# Patient Record
Sex: Male | Born: 1935 | Race: White | Hispanic: No | Marital: Married | Smoking: Former smoker
Health system: Southern US, Community
[De-identification: ages and names within clinical notes are randomized; demographics above are authoritative.]

## PROBLEM LIST (undated history)

## (undated) DIAGNOSIS — I4891 Unspecified atrial fibrillation: Secondary | ICD-10-CM

## (undated) DIAGNOSIS — E785 Hyperlipidemia, unspecified: Secondary | ICD-10-CM

## (undated) DIAGNOSIS — I10 Essential (primary) hypertension: Secondary | ICD-10-CM

## (undated) DIAGNOSIS — M169 Osteoarthritis of hip, unspecified: Secondary | ICD-10-CM

## (undated) DIAGNOSIS — M1711 Unilateral primary osteoarthritis, right knee: Secondary | ICD-10-CM

## (undated) DIAGNOSIS — E079 Disorder of thyroid, unspecified: Secondary | ICD-10-CM

## (undated) DIAGNOSIS — M25579 Pain in unspecified ankle and joints of unspecified foot: Secondary | ICD-10-CM

## (undated) DIAGNOSIS — I219 Acute myocardial infarction, unspecified: Secondary | ICD-10-CM

## (undated) HISTORY — DX: Unspecified atrial fibrillation: I48.91

## (undated) HISTORY — DX: Essential (primary) hypertension: I10

## (undated) HISTORY — DX: Hyperlipidemia, unspecified: E78.5

## (undated) HISTORY — DX: Acute myocardial infarction, unspecified: I21.9

## (undated) HISTORY — DX: Osteoarthritis of hip, unspecified: M16.9

## (undated) HISTORY — PX: CORONARY ANGIOPLASTY WITH STENT PLACEMENT: SHX49

## (undated) HISTORY — DX: Disorder of thyroid, unspecified: E07.9

## (undated) HISTORY — DX: Pain in unspecified ankle and joints of unspecified foot: M25.579

## (undated) HISTORY — DX: Unilateral primary osteoarthritis, right knee: M17.11

---

## 2005-01-07 ENCOUNTER — Ambulatory Visit: Payer: Self-pay | Admitting: Gastroenterology

## 2005-07-31 ENCOUNTER — Ambulatory Visit: Payer: Self-pay | Admitting: Family Medicine

## 2005-12-09 ENCOUNTER — Emergency Department: Payer: Self-pay | Admitting: Emergency Medicine

## 2005-12-09 ENCOUNTER — Other Ambulatory Visit: Payer: Self-pay

## 2007-05-07 ENCOUNTER — Ambulatory Visit: Payer: Self-pay | Admitting: Family Medicine

## 2013-06-22 DIAGNOSIS — I2510 Atherosclerotic heart disease of native coronary artery without angina pectoris: Secondary | ICD-10-CM | POA: Insufficient documentation

## 2013-06-22 DIAGNOSIS — I10 Essential (primary) hypertension: Secondary | ICD-10-CM | POA: Insufficient documentation

## 2013-06-22 DIAGNOSIS — I4891 Unspecified atrial fibrillation: Secondary | ICD-10-CM | POA: Insufficient documentation

## 2013-06-22 DIAGNOSIS — E039 Hypothyroidism, unspecified: Secondary | ICD-10-CM | POA: Insufficient documentation

## 2013-06-22 DIAGNOSIS — I482 Chronic atrial fibrillation, unspecified: Secondary | ICD-10-CM | POA: Insufficient documentation

## 2013-11-14 DIAGNOSIS — E785 Hyperlipidemia, unspecified: Secondary | ICD-10-CM | POA: Insufficient documentation

## 2013-11-14 DIAGNOSIS — I2109 ST elevation (STEMI) myocardial infarction involving other coronary artery of anterior wall: Secondary | ICD-10-CM | POA: Insufficient documentation

## 2013-11-14 DIAGNOSIS — Z9889 Other specified postprocedural states: Secondary | ICD-10-CM | POA: Insufficient documentation

## 2014-11-20 DIAGNOSIS — M25579 Pain in unspecified ankle and joints of unspecified foot: Secondary | ICD-10-CM

## 2014-11-20 HISTORY — DX: Pain in unspecified ankle and joints of unspecified foot: M25.579

## 2015-04-02 ENCOUNTER — Encounter: Admission: RE | Payer: Self-pay | Source: Ambulatory Visit

## 2015-04-02 ENCOUNTER — Ambulatory Visit: Admission: RE | Admit: 2015-04-02 | Payer: Self-pay | Source: Ambulatory Visit | Admitting: Ophthalmology

## 2015-04-02 SURGERY — PHACOEMULSIFICATION, CATARACT, WITH IOL INSERTION
Anesthesia: Topical | Laterality: Left

## 2015-04-05 DIAGNOSIS — N183 Chronic kidney disease, stage 3 unspecified: Secondary | ICD-10-CM | POA: Insufficient documentation

## 2015-04-05 DIAGNOSIS — Z79891 Long term (current) use of opiate analgesic: Secondary | ICD-10-CM | POA: Insufficient documentation

## 2015-04-05 DIAGNOSIS — M169 Osteoarthritis of hip, unspecified: Secondary | ICD-10-CM

## 2015-04-05 HISTORY — DX: Osteoarthritis of hip, unspecified: M16.9

## 2015-04-24 ENCOUNTER — Telehealth: Payer: Self-pay | Admitting: *Deleted

## 2015-04-24 NOTE — Telephone Encounter (Signed)
lm made the patient aware that I would love to make an appt for him. I asked if he could please return my call....TD

## 2015-05-17 ENCOUNTER — Ambulatory Visit: Payer: Medicare Other | Attending: Anesthesiology | Admitting: Anesthesiology

## 2015-05-17 ENCOUNTER — Encounter: Payer: Self-pay | Admitting: Anesthesiology

## 2015-05-17 VITALS — BP 100/68 | HR 73 | Temp 98.1°F | Resp 16 | Ht 72.0 in | Wt 206.0 lb

## 2015-05-17 DIAGNOSIS — M1711 Unilateral primary osteoarthritis, right knee: Secondary | ICD-10-CM

## 2015-05-17 DIAGNOSIS — M19079 Primary osteoarthritis, unspecified ankle and foot: Secondary | ICD-10-CM

## 2015-05-17 DIAGNOSIS — M25561 Pain in right knee: Principal | ICD-10-CM

## 2015-05-17 DIAGNOSIS — G8929 Other chronic pain: Secondary | ICD-10-CM

## 2015-05-17 MED ORDER — MELOXICAM 7.5 MG PO TABS: 7.5000 mg | ORAL_TABLET | Freq: Two times a day (BID) | ORAL | Status: DC

## 2015-05-17 NOTE — Progress Notes (Signed)
Safety precautions to be maintained throughout the outpatient stay will include: orient to surroundings, keep bed in low position, maintain call bell within reach at all times, provide assistance with transfer out of bed and ambulation.  

## 2015-05-17 NOTE — Patient Instructions (Signed)
A prescription for Meloxicam was sent to your pharmacy. 

## 2015-05-22 DIAGNOSIS — M1711 Unilateral primary osteoarthritis, right knee: Secondary | ICD-10-CM

## 2015-05-22 HISTORY — DX: Unilateral primary osteoarthritis, right knee: M17.11
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
## 2015-06-15 ENCOUNTER — Ambulatory Visit: Payer: Medicare Other | Attending: Anesthesiology | Admitting: Anesthesiology

## 2015-06-15 ENCOUNTER — Encounter: Payer: Self-pay | Admitting: Anesthesiology

## 2015-06-15 VITALS — BP 110/73 | HR 80 | Temp 99.1°F | Resp 16 | Ht 71.0 in | Wt 206.0 lb

## 2015-06-15 DIAGNOSIS — M25579 Pain in unspecified ankle and joints of unspecified foot: Secondary | ICD-10-CM | POA: Diagnosis present

## 2015-06-15 DIAGNOSIS — M25572 Pain in left ankle and joints of left foot: Secondary | ICD-10-CM | POA: Diagnosis not present

## 2015-06-15 DIAGNOSIS — Z7901 Long term (current) use of anticoagulants: Secondary | ICD-10-CM | POA: Diagnosis not present

## 2015-06-15 DIAGNOSIS — G8929 Other chronic pain: Secondary | ICD-10-CM | POA: Diagnosis not present

## 2015-06-15 DIAGNOSIS — M19072 Primary osteoarthritis, left ankle and foot: Secondary | ICD-10-CM | POA: Insufficient documentation

## 2015-06-15 DIAGNOSIS — I4891 Unspecified atrial fibrillation: Secondary | ICD-10-CM | POA: Diagnosis not present

## 2015-06-15 DIAGNOSIS — I2510 Atherosclerotic heart disease of native coronary artery without angina pectoris: Secondary | ICD-10-CM | POA: Diagnosis not present

## 2015-06-15 DIAGNOSIS — M25561 Pain in right knee: Secondary | ICD-10-CM | POA: Insufficient documentation

## 2015-06-15 DIAGNOSIS — M19071 Primary osteoarthritis, right ankle and foot: Secondary | ICD-10-CM | POA: Insufficient documentation

## 2015-06-15 DIAGNOSIS — M1711 Unilateral primary osteoarthritis, right knee: Secondary | ICD-10-CM

## 2015-06-15 DIAGNOSIS — M153 Secondary multiple arthritis: Secondary | ICD-10-CM

## 2015-06-15 DIAGNOSIS — M25562 Pain in left knee: Secondary | ICD-10-CM | POA: Diagnosis not present

## 2015-06-15 DIAGNOSIS — M25571 Pain in right ankle and joints of right foot: Secondary | ICD-10-CM | POA: Diagnosis not present

## 2015-06-15 MED ORDER — MELOXICAM 7.5 MG PO TABS: 7.5000 mg | ORAL_TABLET | Freq: Two times a day (BID) | ORAL | Status: DC

## 2015-06-15 NOTE — Progress Notes (Signed)
Safety precautions to be maintained throughout the outpatient stay will include: orient to surroundings, keep bed in low position, maintain call bell within reach at all times, provide assistance with transfer out of bed and ambulation.  

## 2015-06-17 NOTE — Progress Notes (Signed)
   Subjective:    Patient ID: Louis Wells, male    DOB: 05/13/1932, 82 y.o.   MRN: [id number]  HPI  This 82 year old gentleman presented with bila ankle pain secondary to osteoarthritis of all the joints Physical examination showed there to be in both of his knees. He indicated that the right knee was mo and accordingly with plan to perform a right femoral nerve block for him today The patient takes Eliquis as an anticoagulant to manage prophylactically his cardiac disease I explained to the patient that I would no discontinue the other quis to perform the femoral artery nerve bl for him without discussing it with his card I explained to him that in the performance to the femoral artery and it may be catastrophic to have a perforation of that femoral artery while the patient is on Eliquis Louis Wells made the several attempts to contact his  But we were not successful in doing so  Review of Systems  Constitutional: Negative.   HENT: Negative.   Eyes: Negative.   Respiratory: Negative.   Cardiovascular:       This patient is taken the anticoagulant  medicine called Eliquis to manage his coronary artery disease and is severe atrial fibrillation It is prudent to discontinue the Eliquis prior to perferve block close to major vessels  Gastrointestinal: Negative.   Endocrine: Negative.   Genitourinary: Negative.   Musculoskeletal: Positive for myalgias, joint swelling and arthralgias.       Bilateral knee pain and  Bilateral ankle pain secondary to bilateral senescent osteoarthritis of both ankles and both knee joints  Skin: Negative.   Allergic/Immunologic: Negative.   Neurological: Negative.   Hematological: Negative.   Psychiatric/Behavioral: Negative.        Objective:   Physical Exam  Cardiovascular:  Patient ambulated with assistance of a walker He appeared to be in no significant distress His blood pressure was 110/73 mmHg His pulse was 80 bpm Equal and slightly  irregular Temperature was 99.27F Respirations were 16 breaths per min S PO2 was 99% Chest is clinically clear There are no adventitious sounds Abdomen is soft and nontender There is no palpable organomegaly There is no significant lymphadenopathy Pupils are equal and reactive Cranial nerves are intact There is tenderness of both knees with decrease in range of motion and significant crepitus in both knees especially in the knees  Nursing note and vitals reviewed.         Assessment & Plan:   Assessment 1 hronic bilateral knee pain 2  severe osteoarthritis of both knee 3 pain in both ankles 4 Osteoarthritis of both ankles 5 status post atrial fibrillation    Plan of management 1 I attempted to contact the patient's cardiologist but was unsuccessful 2 after some discussion the patient indicated that he is not interested the request Eliquis 3 therefore I would postpone the right femoral block which was planned for him today 4 I will prescribed meloxicam 7.5 mg twice a day and give him 45 tablets 5 i will follow-up with him in about 1 month's time to explore other therapeutic options    Established patient        Level III    Tod PersiaWinston Tanaja Ganger M.D.

## 2015-06-18 ENCOUNTER — Telehealth: Payer: Self-pay

## 2015-06-18 NOTE — Telephone Encounter (Signed)
Returned call to patient, message left, informing patient that I do not know if Dr. Starling MannsParris plans to write prescription, can discuss at next appt.

## 2015-06-18 NOTE — Telephone Encounter (Signed)
Wants to know is Dr. Starling MannsParris going to write pt prescription

## 2015-07-03 ENCOUNTER — Ambulatory Visit
Admission: RE | Admit: 2015-07-03 | Discharge: 2015-07-03 | Disposition: A | Discharge status: Home / Self Care | Payer: Medicare Other | Source: Ambulatory Visit | Attending: Pain Medicine | Admitting: Pain Medicine

## 2015-07-03 ENCOUNTER — Other Ambulatory Visit
Admission: RE | Admit: 2015-07-03 | Discharge: 2015-07-03 | Disposition: A | Discharge status: Home / Self Care | Payer: Medicare Other | Source: Ambulatory Visit | Attending: Pain Medicine | Admitting: Pain Medicine

## 2015-07-03 ENCOUNTER — Encounter: Payer: Self-pay | Admitting: Pain Medicine

## 2015-07-03 ENCOUNTER — Ambulatory Visit (HOSPITAL_BASED_OUTPATIENT_CLINIC_OR_DEPARTMENT_OTHER): Payer: Medicare Other | Admitting: Pain Medicine

## 2015-07-03 VITALS — BP 118/66 | HR 89 | Temp 98.4°F | Resp 16 | Ht 71.0 in | Wt 206.0 lb

## 2015-07-03 DIAGNOSIS — G8929 Other chronic pain: Secondary | ICD-10-CM

## 2015-07-03 DIAGNOSIS — I739 Peripheral vascular disease, unspecified: Secondary | ICD-10-CM | POA: Diagnosis not present

## 2015-07-03 DIAGNOSIS — M25571 Pain in right ankle and joints of right foot: Secondary | ICD-10-CM | POA: Insufficient documentation

## 2015-07-03 DIAGNOSIS — M19071 Primary osteoarthritis, right ankle and foot: Secondary | ICD-10-CM

## 2015-07-03 DIAGNOSIS — M25551 Pain in right hip: Secondary | ICD-10-CM | POA: Insufficient documentation

## 2015-07-03 DIAGNOSIS — Z5181 Encounter for therapeutic drug level monitoring: Secondary | ICD-10-CM

## 2015-07-03 DIAGNOSIS — Z79891 Long term (current) use of opiate analgesic: Secondary | ICD-10-CM | POA: Diagnosis not present

## 2015-07-03 DIAGNOSIS — M25471 Effusion, right ankle: Secondary | ICD-10-CM | POA: Insufficient documentation

## 2015-07-03 DIAGNOSIS — M170 Bilateral primary osteoarthritis of knee: Secondary | ICD-10-CM

## 2015-07-03 DIAGNOSIS — M1611 Unilateral primary osteoarthritis, right hip: Secondary | ICD-10-CM

## 2015-07-03 DIAGNOSIS — F1190 Opioid use, unspecified, uncomplicated: Secondary | ICD-10-CM

## 2015-07-03 DIAGNOSIS — M25572 Pain in left ankle and joints of left foot: Principal | ICD-10-CM

## 2015-07-03 DIAGNOSIS — M19072 Primary osteoarthritis, left ankle and foot: Secondary | ICD-10-CM

## 2015-07-03 DIAGNOSIS — M25579 Pain in unspecified ankle and joints of unspecified foot: Secondary | ICD-10-CM | POA: Insufficient documentation

## 2015-07-03 DIAGNOSIS — Z0189 Encounter for other specified special examinations: Secondary | ICD-10-CM

## 2015-07-03 DIAGNOSIS — Z79899 Other long term (current) drug therapy: Secondary | ICD-10-CM

## 2015-07-03 DIAGNOSIS — R209 Unspecified disturbances of skin sensation: Secondary | ICD-10-CM

## 2015-07-03 DIAGNOSIS — M25561 Pain in right knee: Secondary | ICD-10-CM | POA: Insufficient documentation

## 2015-07-03 DIAGNOSIS — M25562 Pain in left knee: Secondary | ICD-10-CM

## 2015-07-03 LAB — COMPREHENSIVE METABOLIC PANEL
ALBUMIN: 4.2 g/dL (ref 3.5–5.0)
ALK PHOS: 87 U/L (ref 38–126)
ALT: 11 U/L — AB (ref 17–63)
ANION GAP: 6 (ref 5–15)
AST: 22 U/L (ref 15–41)
BUN: 32 mg/dL — ABNORMAL HIGH (ref 6–20)
CALCIUM: 9 mg/dL (ref 8.9–10.3)
CHLORIDE: 106 mmol/L (ref 101–111)
CO2: 25 mmol/L (ref 22–32)
CREATININE: 1.45 mg/dL — AB (ref 0.61–1.24)
GFR calc Af Amer: 51 mL/min — ABNORMAL LOW (ref >60)
GFR calc non Af Amer: 44 mL/min — ABNORMAL LOW (ref >60)
GLUCOSE: 92 mg/dL (ref 65–99)
Potassium: 5.3 mmol/L — ABNORMAL HIGH (ref 3.5–5.1)
Sodium: 137 mmol/L (ref 135–145)
Total Bilirubin: 0.6 mg/dL (ref 0.3–1.2)
Total Protein: 7.4 g/dL (ref 6.5–8.1)

## 2015-07-03 LAB — VITAMIN B12: VITAMIN B 12: 806 pg/mL (ref 180–914)

## 2015-07-03 LAB — MAGNESIUM: Magnesium: 2.1 mg/dL (ref 1.7–2.4)

## 2015-07-03 LAB — SEDIMENTATION RATE: SED RATE: 26 mm/h — AB (ref 0–20)

## 2015-07-03 LAB — C-REACTIVE PROTEIN: CRP: 0.7 mg/dL (ref <1.0)

## 2015-07-03 NOTE — Progress Notes (Signed)
Safety precautions to be maintained throughout the outpatient stay will include: orient to surroundings, keep bed in low position, maintain call bell within reach at all times, provide assistance with transfer out of bed and ambulation. Hydrocodone pill count #55/60  Filled 06-29-15

## 2015-07-03 NOTE — Progress Notes (Signed)
Patient's Name: Louis Wells  Patient type: New patient  MRN: [id number]  Service setting: Ambulatory outpatient  DOB: 08/15/1932  Location: [facility]  DOS: 07/03/2015  Primary Care Physician: No primary care provider on file.  Note by: Phuong Hillary A. Laban EmperorNaveira, M.D, DABA, DABAPM, DABPM, DABIPP, FIPP  Referring Physician: Dayna BarkerBehalal-Bock, Christele*  Specialty: Board-Certified Interventional Pain Management     Primary Reason(s) for Visit: Initial Patient Evaluation CC: Knee Pain; Ankle Pain; and Hip Pain   HPI  Louis Wells is a 82 y.o. year old, male patient, who comes today for an initial evaluation. He has Atrial fibrillation (HCC); Acute myocardial infarction of anterior wall (HCC); Arteriosclerosis of coronary artery; Chronic kidney disease (CKD), stage III (moderate); Long term current use of opiate analgesic; History of cardiac catheterization; BP (high blood pressure); HLD (hyperlipidemia); Adult hypothyroidism; Chronic pain; Long term prescription opiate use; Opiate use; Encounter for therapeutic drug level monitoring; Encounter for pain management planning; Disturbance of skin sensation; Chronic right hip pain; Chronic ankle pain (Bilateral) (L>R); Chronic knee pain (Bilateral) (R>L); Osteoarthritis of knee (Bilateral) (R>L); Osteoarthritis of hip (Right); and Osteoarthritis of ankle (Bilateral) (L>R) on his problem list.. His primarily concern today is the Knee Pain; Ankle Pain; and Hip Pain   Pain Assessment: Self-Reported Pain Score: 4  Reported level of pain is compatible with clinical observations Pain Type: Chronic pain Pain Location: Knee Pain Orientation: Right Pain Descriptors / Indicators: Sharp Pain Frequency: Constant  Onset and Duration: Gradual, Date of onset: 3 years ago and Present longer than 3 months Cause of pain: Arthritis Severity: Getting worse, NAS-11 at its worse: 10/10, NAS-11 at its best: 7/10, NAS-11 now: 4/10 and NAS-11 on the  average: 8/10 Timing: Not influenced by the time of the day and During activity or exercise Aggravating Factors: Climbing, Kneeling, Motion, Prolonged standing, Squatting, Stooping , Walking, Walking uphill and Walking downhill Alleviating Factors: Sitting Associated Problems: Swelling and Pain that wakes patient up Quality of Pain: Aching, Agonizing, Deep, Disabling, Fearful, Horrible, Sharp, Throbbing and Uncomfortable Previous Examinations or Tests: X-rays and Orthoperdic evaluation Previous Treatments: Hip and knee injections  The patient comes into the clinics today for the first time for a chronic pain management evaluation. The patient comes in today for the first time to see me in a wheelchair complaining of right hip and bilateral knee pain as well as bilateral ankle pain. He indicates that he can do rather well by taking 4 Tylenol per day plus his to hydrocodone. Today I have reviewed the results of his old lab work, which we will be repeating today, but assuming that everything is still the same his liver functions seems to be okay but his kidney function is the one that is a little deficient. Because of this I have recommended that he stop using the meloxicam and go back to taking the Tylenol but to also make sure that it's the 500 mg Tylenol and not the 650. Using the 500 mg Tylenol also, he would be ingesting 2000 mg/day plus another 650 from the 2 hydrocodone tablets. This will bring it up to 2650 mg/day, which is acceptable.  In terms of the meloxicam I have recommended that he stop this for 3 reasons: The first reason is that his GFR is decreased. The second reason is that he has a cardiovascular history and NSAIDs may increase the risk of cardiovascular events. The third reason is that he is on a blood thinner and has a prior history of  bleeding ulcers.   Historic Controlled Substance Pharmacotherapy Review  Previously Prescribed Opioids: Hydrocodone/APAP 5/325 one twice a day (10  mg/day of hydrocodone) Currently Prescribed Analgesic: Hydrocodone/APAP 5/325 one twice a day (10 mg/day of hydrocodone) Medications: Patient brought medications to be checked, as requested MME/day: 10 mg/day Pharmacodynamics: Analgesic Effect: More than 50% Activity Facilitation: Medication(s) allow patient to sit, stand, walk, and do the basic ADLs Perceived Effectiveness: Described as relatively effective, allowing for increase in activities of daily living (ADL) Side-effects or Adverse reactions: None reported Historical Background Evaluation: [place] PDMP: Five (5) year initial data search conducted. No abnormal patterns identified [place] [employer] Offender Public Information: Non-contributory Historical Hospital-associated UDS Results:  No results found for: THCU, COCAINSCRNUR, PCPSCRNUR, MDMA, AMPHETMU, METHADONE, ETOH UDS Results: No UDS results available at this time UDS Interpretation: N/A Medication Assessment Form: Not applicable. Initial evaluation. The patient has not received any medications from our practice Treatment compliance: Not applicable. Initial evaluation Risk Assessment: Aberrant Behavior: None observed or detected today Opioid Fatal Overdose Risk Factors: None identified today Non-fatal overdose hazard ratio (HR): Calculation deferred Fatal overdose hazard ratio (HR): Calculation deferred Substance Use Disorder (SUD) Risk Level: Pending results of Medical Psychology Evaluation for SUD Opioid Risk Tool (ORT) Score:  3 Low Risk for SUD (Score <3) Depression Scale Score: PHQ-2: PHQ-2 Total Score: 0 No depression (0) PHQ-9: PHQ-9 Total Score: 0 No depression (0-4)  Pharmacologic Plan: Pending ordered tests and/or consults  Meds  The patient has a current medication list which includes the following prescription(s): apixaban, cyanocobalamin, diclofenac sodium, famotidine, hydrocodone-acetaminophen, levothyroxine, lisinopril, metoprolol, and  nitroglycerin.  ROS  Cardiovascular History: Heart attack ( Date: 2010) and Blood thinners:  Antiplatelet Pulmonary or Respiratory History: Snoring  Neurological History: Negative for epilepsy, stroke, urinary or fecal inontinence, spina bifida or tethered cord syndrome Review of Past Neurological Studies: No results found for this or any previous visit. Psychological-Psychiatric History: Negative for anxiety, depression, schizophrenia, bipolar disorders or suicidal ideations or attempts Gastrointestinal History: Negative for peptic ulcer disease, hiatal hernia, GERD, IBS, hepatitis, cirrhosis or pancreatitis Genitourinary History: Negative for nephrolithiasis, hematuria, renal failure or chronic kidney disease Hematological History: Brusing easily, Bleeding easily and Positive for taking the following blood thinner: Elaquis Endocrine History: Hypothyroidism Rheumatologic History: Osteoarthritis Musculoskeletal History: Negative for myasthenia gravis, muscular dystrophy, multiple sclerosis or malignant hyperthermia Work History: Retired  Allergies  Mr. Cail is allergic to contrast media and amoxicillin.  PFSH  Medical:  Mr. Inclan  has a past medical history of Hyperlipidemia; Hypertension; Atrial fibrillation (HCC); Myocardial infarction (HCC); Thyroid disease; Primary osteoarthritis of right knee (05/22/2015); Degenerative arthritis of hip (04/05/2015); and Arthralgia of ankle or foot (11/20/2014). Family: family history includes Cancer in his mother; Diabetes in his father. Surgical:  has past surgical history that includes Coronary angioplasty with stent. Tobacco:  reports that he has quit smoking. He does not have any smokeless tobacco history on file. Alcohol:  reports that he does not drink alcohol. Drug:  reports that he does not use illicit drugs. Active Ambulatory Problems    Diagnosis Date Noted  . Atrial fibrillation (HCC) 06/22/2013  . Acute myocardial infarction of  anterior wall (HCC) 11/14/2013  . Arteriosclerosis of coronary artery 06/22/2013  . Chronic kidney disease (CKD), stage III (moderate) 04/05/2015  . Long term current use of opiate analgesic 04/05/2015  . History of cardiac catheterization 11/14/2013  . BP (high blood pressure) 06/22/2013  . HLD (hyperlipidemia) 11/14/2013  . Adult hypothyroidism 06/22/2013  .  Chronic pain 07/03/2015  . Long term prescription opiate use 07/03/2015  . Opiate use 07/03/2015  . Encounter for therapeutic drug level monitoring 07/03/2015  . Encounter for pain management planning 07/03/2015  . Disturbance of skin sensation 07/03/2015  . Chronic right hip pain 07/03/2015  . Chronic ankle pain (Bilateral) (L>R) 07/03/2015  . Chronic knee pain (Bilateral) (R>L) 07/03/2015  . Osteoarthritis of knee (Bilateral) (R>L) 07/03/2015  . Osteoarthritis of hip (Right) 07/03/2015  . Osteoarthritis of ankle (Bilateral) (L>R) 07/03/2015   Resolved Ambulatory Problems    Diagnosis Date Noted  . No Resolved Ambulatory Problems   Past Medical History  Diagnosis Date  . Hyperlipidemia   . Hypertension   . Myocardial infarction (HCC)   . Thyroid disease   . Primary osteoarthritis of right knee 05/22/2015  . Degenerative arthritis of hip 04/05/2015  . Arthralgia of ankle or foot 11/20/2014    Constitutional Exam  Vitals: Blood pressure 118/66, pulse 89, temperature 98.4 F (36.9 C), resp. rate 16, height 5\' 11"  (1.803 m), weight 206 lb (93.441 kg), SpO2 99 %. General appearance: Well nourished, well developed, and well hydrated. In no acute distress Calculated BMI/Body habitus: Body mass index is 28.74 kg/(m^2). (25-29.9 kg/m2) Overweight - 20% higher incidence of chronic pain Psych/Mental status: Alert and oriented x 3 (person, place, & time) Eyes: PERLA Respiratory: No evidence of acute respiratory distress  Cervical Spine Exam  Inspection: No masses, redness, or swelling Alignment: Symmetrical ROM: Functional:  Adequate ROM Active: Unrestricted ROM Stability: No instability detected Muscle strength & Tone: Functionally intact Sensory: Unimpaired Palpation: No complaints of tenderness  Upper Extremity (UE) Exam    Side: Right upper extremity  Side: Left upper extremity  Inspection: No masses, redness, swelling, or asymmetry  Inspection: No masses, redness, swelling, or asymmetry  ROM:  ROM:  Functional: Adequate ROM  Functional: Adequate ROM  Active: Unrestricted ROM  Active: Unrestricted ROM  Muscle strength & Tone: Functionally intact  Muscle strength & Tone: Functionally intact  Sensory: Unimpaired  Sensory: Unimpaired  Palpation: Non-contributory  Palpation: Non-contributory   Thoracic Spine Exam  Inspection: No masses, redness, or swelling Alignment: Symmetrical ROM: Functional: Adequate ROM Active: Unrestricted ROM Stability: No instability detected Sensory: Unimpaired Muscle strength & Tone: Functionally intact Palpation: No complaints of tenderness  Lumbar Spine Exam  Inspection: No masses, redness, or swelling Alignment: Symmetrical ROM: Functional: Adequate ROM Active: Unrestricted ROM Stability: No instability detected Muscle strength & Tone: Functionally intact Sensory: Unimpaired Palpation: No complaints of tenderness Provocative Tests: Lumbar Hyperextension and rotation test: deferred Patrick's Maneuver: deferred  Gait & Posture Assessment  Ambulation: Patient comes in today in a wheel chair Gait: Patient is in a wheelchair Posture: Poor  Lower Extremity Exam    Side: Right lower extremity  Side: Left lower extremity  Inspection: No masses, redness, swelling, or asymmetry ROM:  Inspection: No masses, redness, swelling, or asymmetry ROM:  Functional: Limited ROM at the hip, knee, and ankle.  Functional: Limited ROM at the knee and ankle   Active: Decreased ROM at the hip, knee, and ankle.   Active: Decreased ROM at the knee and ankle   Muscle strength &  Tone: Mild-to-moderate deconditioning  Muscle strength & Tone: Functionally intact  Sensory: Unimpaired  Sensory: Unimpaired  Palpation: Non-contributory  Palpation: Non-contributory   Assessment  Primary Diagnosis & Pertinent Problem List: The primary encounter diagnosis was Chronic pain. Diagnoses of Long term prescription opiate use, Opiate use, Long term current use of opiate analgesic, Encounter  for therapeutic drug level monitoring, Encounter for pain management planning, Disturbance of skin sensation, Chronic right hip pain, Chronic ankle pain, left, Chronic knee pain (Bilateral) (R>L), Primary osteoarthritis of both knees, Primary osteoarthritis of right hip, and Osteoarthritis of ankle (Bilateral) (L>R) were also pertinent to this visit.  Visit Diagnosis: 1. Chronic pain   2. Long term prescription opiate use   3. Opiate use   4. Long term current use of opiate analgesic   5. Encounter for therapeutic drug level monitoring   6. Encounter for pain management planning   7. Disturbance of skin sensation   8. Chronic right hip pain   9. Chronic ankle pain, left   10. Chronic knee pain (Bilateral) (R>L)   11. Primary osteoarthritis of both knees   12. Primary osteoarthritis of right hip   13. Osteoarthritis of ankle (Bilateral) (L>R)     Assessment: No problem-specific assessment & plan notes found for this encounter.   Plan of Care  Initial Treatment Plan:  Please be advised that as per protocol, today's visit has been an evaluation only. We have not taken over the patient's controlled substance management.  Problem List Items Addressed This Visit      High   Chronic ankle pain (Bilateral) (L>R) (Chronic)   Relevant Orders   DG Ankle Complete Left   DG Ankle Complete Right   Chronic knee pain (Bilateral) (R>L) (Chronic)   Relevant Medications   diclofenac sodium (VOLTAREN) 1 % GEL   Chronic pain - Primary (Chronic)   Relevant Orders   Comprehensive metabolic panel    C-reactive protein   Magnesium   Sedimentation rate   25-Hydroxyvitamin D Lcms D2+D3   Chronic right hip pain (Chronic)   Osteoarthritis of ankle (Bilateral) (L>R) (Chronic)   Osteoarthritis of hip (Right) (Chronic)   Osteoarthritis of knee (Bilateral) (R>L) (Chronic)     Medium   Encounter for pain management planning   Encounter for therapeutic drug level monitoring   Long term current use of opiate analgesic (Chronic)   Long term prescription opiate use (Chronic)   Relevant Orders   ToxASSURE Select 13 (MW), Urine   Ambulatory referral to Psychology   Opiate use (Chronic)     Low   Disturbance of skin sensation   Relevant Orders   Vitamin B12      Pharmacotherapy (Medications Ordered): No orders of the defined types were placed in this encounter.    Lab-work & Procedure Ordered: Orders Placed This Encounter  Procedures  . DG Ankle Complete Left  . DG Ankle Complete Right  . ToxASSURE Select 13 (MW), Urine  . Comprehensive metabolic panel  . C-reactive protein  . Magnesium  . Sedimentation rate  . Vitamin B12  . 25-Hydroxyvitamin D Lcms D2+D3  . Ambulatory referral to Psychology    Imaging Ordered: AMB REFERRAL TO PSYCHOLOGY DG ANKLE COMPLETE LEFT DG ANKLE COMPLETE RIGHT  Interventional Therapies: Scheduled:  None at this time.    Considering:   1. Diagnostic right intra-articular hip joint injection under fluoroscopic guidance, with or without sedation.  2. Diagnostic bilateral intra-articular knee joint injection, without fluoroscopy or sedation.    PRN Procedures:  None at this time.    Referral(s) or Consult(s): Medical psychology consult for substance use disorder evaluation  Medications administered during this visit: Mr. Stidd had no medications administered during this visit.  Prescriptions ordered during this visit: New Prescriptions   No medications on file    Requested PM Follow-up: Return for Return  after MedPsych (SUD) Eval.  No  future appointments.   Primary Care Physician: No primary care provider on file. Location: [facility] Note by: Layla Gramm A. Laban Emperor, M.D, DABA, DABAPM, DABPM, DABIPP, FIPP  Pain Score Disclaimer: We use the NRS-11 scale. This is a self-reported, subjective measurement of pain severity with only modest accuracy. It is used primarily to identify changes within a particular patient. It must be understood that outpatient pain scales are significantly less accurate that those used for research, where they can be applied under ideal controlled circumstances with minimal exposure to variables. In reality, the score is likely to be a combination of pain intensity and pain affect, where pain affect describes the degree of emotional arousal or changes in action readiness caused by the sensory experience of pain. Factors such as social and work situation, setting, emotional state, anxiety levels, expectation, and prior pain experience may influence pain perception and show large inter-individual differences that may also be affected by time variables.  Patient instructions provided during this appointment: There are no Patient Instructions on file for this visit.

## 2015-07-07 LAB — 25-HYDROXYVITAMIN D LCMS D2+D3: 25-HYDROXY, VITAMIN D: 35 ng/mL

## 2015-07-07 LAB — 25-HYDROXY VITAMIN D LCMS D2+D3: 25-Hydroxy, Vitamin D-3: 34 ng/mL

## 2015-07-12 LAB — TOXASSURE SELECT 13 (MW), URINE

## 2015-07-16 NOTE — Progress Notes (Signed)
Quick Note:   Normal Potassium levels are between 3.5 and 5.0 mEq/L. Levels between 5.1 and 6.0 are considered to be mild hyperkalemia. Between 6.1 to 7.0 are moderate, and anything above 7.0 mEq/L is considered to be severe. Hyperkalemia can result from increased potassium intake, decreased potassium excretion, or a shift of potassium from the intracellular to the extracellular space. The most common causes involve decreased excretion. The most common complaints are weakness and fatigue. Occasionally, a patient may complain of frank muscle paralysis or shortness of breath. Patients also may complain of palpitations or chest pain. Patients may report nausea, vomiting, and paresthesias. Levels higher than 5.5 are considered significant and those above 7 mEq/L are associated with significant hemodynamic and neurological consequences.  BUN levels between 7 to 20 mg/dL (2.5 to 7.1 mmol/L) are considered normal. Elevated blood urea nitrogen can also be due to: urinary tract obstruction; congestive heart failure or recent heart attack; gastrointestinal bleeding; dehydration; shock; severe burns; certain medications, such as corticosteroids and some antibiotics; and/or a high protein diet.  Normal Creatinine levels are between 0.5 and 0.9 mg/dl for our lab. Any condition that impairs the function of the kidneys is likely to raise the creatinine level in the blood. The most common causes of longstanding (chronic) kidney disease in adults are high blood pressure and diabetes. Other causes of elevated blood creatinine levels include drugs, ingestion of a large amount of dietary meat, kidney infections, rhabdomyolysis (abnormal muscle breakdown), and urinary tract obstruction.  BUN-to-creatinine ratio >20:1 (BUN dispropertionally higher than the creatinine levels) suggests prerenal azotemia (dehydration or renal hypoperfusion), while <10:1 levels suggest renal damage.  While most low ALT level results indicate a  normal healthy liver, that may not always be the case. A low-functioning or non-functioning liver, lacking normal levels of ALT activity to begin with, would not release a lot of ALT into the blood when damaged. People infected with the hepatitis C virus initially show high ALT levels in their blood, but these levels fall over time. Because the ALT test measures ALT levels at only one point in time, people with chronic hepatitis C infection may already have experienced the ALT peak well before blood was drawn for the ALT test. Urinary tract infections or malnutrition may also cause low blood ALT levels. eGFR (Estimated Glomerular Filtration Rate) results are reported as milliliters/minute/1.76m (mL/min/1.761m. Because some laboratories do not collect information on a patient's race when the sample is collected for testing, they may report calculated results for both African Americans and non-African Americans.  The NaNation[facility] InsuranceNNac[facility] Centersuggests only reporting actual results once values are < 60 mL/min. 1. Normal values: 90-120 mL/min 2. Below 60 mL/min suggests that some kidney damage has occurred. 3. Between 5917nd 30 indicate (Moderate) Stage 3 kidney disease. 4. Between 29 and 15 represent (Severe) Stage 4 kidney disease. 5. Less than 15 is considered (Kidney Failure) Stage 5. ______

## 2015-07-16 NOTE — Progress Notes (Signed)

## 2015-07-30 ENCOUNTER — Ambulatory Visit: Payer: Medicare Other | Attending: Pain Medicine | Admitting: Pain Medicine

## 2015-07-30 ENCOUNTER — Encounter: Payer: Self-pay | Admitting: Pain Medicine

## 2015-07-30 VITALS — BP 85/54 | HR 83 | Temp 98.6°F | Resp 18 | Ht 72.0 in | Wt 193.0 lb

## 2015-07-30 DIAGNOSIS — M1711 Unilateral primary osteoarthritis, right knee: Secondary | ICD-10-CM | POA: Insufficient documentation

## 2015-07-30 DIAGNOSIS — M879 Osteonecrosis, unspecified: Secondary | ICD-10-CM | POA: Diagnosis not present

## 2015-07-30 DIAGNOSIS — M25551 Pain in right hip: Secondary | ICD-10-CM | POA: Diagnosis not present

## 2015-07-30 DIAGNOSIS — M170 Bilateral primary osteoarthritis of knee: Secondary | ICD-10-CM | POA: Diagnosis not present

## 2015-07-30 DIAGNOSIS — I4891 Unspecified atrial fibrillation: Secondary | ICD-10-CM | POA: Diagnosis not present

## 2015-07-30 DIAGNOSIS — M25562 Pain in left knee: Secondary | ICD-10-CM

## 2015-07-30 DIAGNOSIS — I129 Hypertensive chronic kidney disease with stage 1 through stage 4 chronic kidney disease, or unspecified chronic kidney disease: Secondary | ICD-10-CM | POA: Diagnosis not present

## 2015-07-30 DIAGNOSIS — N183 Chronic kidney disease, stage 3 (moderate): Secondary | ICD-10-CM | POA: Insufficient documentation

## 2015-07-30 DIAGNOSIS — G8929 Other chronic pain: Secondary | ICD-10-CM | POA: Diagnosis present

## 2015-07-30 DIAGNOSIS — Z79891 Long term (current) use of opiate analgesic: Secondary | ICD-10-CM | POA: Diagnosis not present

## 2015-07-30 DIAGNOSIS — I252 Old myocardial infarction: Secondary | ICD-10-CM | POA: Diagnosis not present

## 2015-07-30 DIAGNOSIS — M87051 Idiopathic aseptic necrosis of right femur: Secondary | ICD-10-CM | POA: Insufficient documentation

## 2015-07-30 DIAGNOSIS — E785 Hyperlipidemia, unspecified: Secondary | ICD-10-CM | POA: Diagnosis not present

## 2015-07-30 DIAGNOSIS — Z87891 Personal history of nicotine dependence: Secondary | ICD-10-CM | POA: Diagnosis not present

## 2015-07-30 DIAGNOSIS — M19071 Primary osteoarthritis, right ankle and foot: Secondary | ICD-10-CM | POA: Insufficient documentation

## 2015-07-30 DIAGNOSIS — M1611 Unilateral primary osteoarthritis, right hip: Secondary | ICD-10-CM | POA: Insufficient documentation

## 2015-07-30 DIAGNOSIS — Z955 Presence of coronary angioplasty implant and graft: Secondary | ICD-10-CM | POA: Diagnosis not present

## 2015-07-30 DIAGNOSIS — I2510 Atherosclerotic heart disease of native coronary artery without angina pectoris: Secondary | ICD-10-CM | POA: Diagnosis not present

## 2015-07-30 DIAGNOSIS — M25561 Pain in right knee: Secondary | ICD-10-CM | POA: Insufficient documentation

## 2015-07-30 DIAGNOSIS — Z833 Family history of diabetes mellitus: Secondary | ICD-10-CM | POA: Diagnosis not present

## 2015-07-30 DIAGNOSIS — M25579 Pain in unspecified ankle and joints of unspecified foot: Secondary | ICD-10-CM | POA: Diagnosis present

## 2015-07-30 MED ORDER — OXYCODONE HCL 5 MG PO TABS: 5.0000 mg | ORAL_TABLET | Freq: Two times a day (BID) | ORAL | Status: DC | PRN

## 2015-07-30 NOTE — Patient Instructions (Signed)
DO NOT EAT OR DRINK FOR 3 HOURS PRIOR TO PROCEDURE STOP ELIQUIS FOR 3 DAYS PRIOR TO PROCEDURE TAKE BLOOD PRESSURE MEDICATION THE MORNING OF PROCEDURE   GENERAL RISKS AND COMPLICATIONS  What are the risk, side effects and possible complications? Generally speaking, most procedures are safe.  However, with any procedure there are risks, side effects, and the possibility of complications.  The risks and complications are dependent upon the sites that are lesioned, or the type of nerve block to be performed.  The closer the procedure is to the spine, the more serious the risks are.  Great care is taken when placing the radio frequency needles, block needles or lesioning probes, but sometimes complications can occur. 1. Infection: Any time there is an injection through the skin, there is a risk of infection.  This is why sterile conditions are used for these blocks.  There are four possible types of infection. 1. Localized skin infection. 2. Central Nervous System Infection-This can be in the form of Meningitis, which can be deadly. 3. Epidural Infections-This can be in the form of an epidural abscess, which can cause pressure inside of the spine, causing compression of the spinal cord with subsequent paralysis. This would require an emergency surgery to decompress, and there are no guarantees that the patient would recover from the paralysis. 4. Discitis-This is an infection of the intervertebral discs.  It occurs in about 1% of discography procedures.  It is difficult to treat and it may lead to surgery.        2. Pain: the needles have to go through skin and soft tissues, will cause soreness.       3. Damage to internal structures:  The nerves to be lesioned may be near blood vessels or    other nerves which can be potentially damaged.       4. Bleeding: Bleeding is more common if the patient is taking blood thinners such as  aspirin, Coumadin, Ticiid, Plavix, etc., or if he/she have some genetic  predisposition  such as hemophilia. Bleeding into the spinal canal can cause compression of the spinal  cord with subsequent paralysis.  This would require an emergency surgery to  decompress and there are no guarantees that the patient would recover from the  paralysis.       5. Pneumothorax:  Puncturing of a lung is a possibility, every time a needle is introduced in  the area of the chest or upper back.  Pneumothorax refers to free air around the  collapsed lung(s), inside of the thoracic cavity (chest cavity).  Another two possible  complications related to a similar event would include: Hemothorax and Chylothorax.   These are variations of the Pneumothorax, where instead of air around the collapsed  lung(s), you may have blood or chyle, respectively.       6. Spinal headaches: They may occur with any procedures in the area of the spine.       7. Persistent CSF (Cerebro-Spinal Fluid) leakage: This is a rare problem, but may occur  with prolonged intrathecal or epidural catheters either due to the formation of a fistulous  track or a dural tear.       8. Nerve damage: By working so close to the spinal cord, there is always a possibility of  nerve damage, which could be as serious as a permanent spinal cord injury with  paralysis.       9. Death:  Although rare, severe deadly allergic reactions known  as "Anaphylactic  reaction" can occur to any of the medications used.      10. Worsening of the symptoms:  We can always make thing worse.  What are the chances of something like this happening? Chances of any of this occuring are extremely low.  By statistics, you have more of a chance of getting killed in a motor vehicle accident: while driving to the hospital than any of the above occurring .  Nevertheless, you should be aware that they are possibilities.  In general, it is similar to taking a shower.  Everybody knows that you can slip, hit your head and get killed.  Does that mean that you should not  shower again?  Nevertheless always keep in mind that statistics do not mean anything if you happen to be on the wrong side of them.  Even if a procedure has a 1 (one) in a 1,000,000 (million) chance of going wrong, it you happen to be that one..Also, keep in mind that by statistics, you have more of a chance of having something go wrong when taking medications.  Who should not have this procedure? If you are on a blood thinning medication (e.g. Coumadin, Plavix, see list of "Blood Thinners"), or if you have an active infection going on, you should not have the procedure.  If you are taking any blood thinners, please inform your physician.  How should I prepare for this procedure?  Do not eat or drink anything at least six hours prior to the procedure.  Bring a driver with you .  It cannot be a taxi.  Come accompanied by an adult that can drive you back, and that is strong enough to help you if your legs get weak or numb from the local anesthetic.  Take all of your medicines the morning of the procedure with just enough water to swallow them.  If you have diabetes, make sure that you are scheduled to have your procedure done first thing in the morning, whenever possible.  If you have diabetes, take only half of your insulin dose and notify our nurse that you have done so as soon as you arrive at the clinic.  If you are diabetic, but only take blood sugar pills (oral hypoglycemic), then do not take them on the morning of your procedure.  You may take them after you have had the procedure.  Do not take aspirin or any aspirin-containing medications, at least eleven (11) days prior to the procedure.  They may prolong bleeding.  Wear loose fitting clothing that may be easy to take off and that you would not mind if it got stained with Betadine or blood.  Do not wear any jewelry or perfume  Remove any nail coloring.  It will interfere with some of our monitoring equipment.  NOTE: Remember that  this is not meant to be interpreted as a complete list of all possible complications.  Unforeseen problems may occur.  BLOOD THINNERS The following drugs contain aspirin or other products, which can cause increased bleeding during surgery and should not be taken for 2 weeks prior to and 1 week after surgery.  If you should need take something for relief of minor pain, you may take acetaminophen which is found in Tylenol,m Datril, Anacin-3 and Panadol. It is not blood thinner. The products listed below are.  Do not take any of the products listed below in addition to any listed on your instruction sheet.  A.P.C or A.P.C with Codeine Codeine  Phosphate Capsules #3 Ibuprofen Ridaura  ABC compound Congesprin Imuran rimadil  Advil Cope Indocin Robaxisal  Alka-Seltzer Effervescent Pain Reliever and Antacid Coricidin or Coricidin-D  Indomethacin Rufen  Alka-Seltzer plus Cold Medicine Cosprin Ketoprofen S-A-C Tablets  Anacin Analgesic Tablets or Capsules Coumadin Korlgesic Salflex  Anacin Extra Strength Analgesic tablets or capsules CP-2 Tablets Lanoril Salicylate  Anaprox Cuprimine Capsules Levenox Salocol  Anexsia-D Dalteparin Magan Salsalate  Anodynos Darvon compound Magnesium Salicylate Sine-off  Ansaid Dasin Capsules Magsal Sodium Salicylate  Anturane Depen Capsules Marnal Soma  APF Arthritis pain formula Dewitt's Pills Measurin Stanback  Argesic Dia-Gesic Meclofenamic Sulfinpyrazone  Arthritis Bayer Timed Release Aspirin Diclofenac Meclomen Sulindac  Arthritis pain formula Anacin Dicumarol Medipren Supac  Analgesic (Safety coated) Arthralgen Diffunasal Mefanamic Suprofen  Arthritis Strength Bufferin Dihydrocodeine Mepro Compound Suprol  Arthropan liquid Dopirydamole Methcarbomol with Aspirin Synalgos  ASA tablets/Enseals Disalcid Micrainin Tagament  Ascriptin Doan's Midol Talwin  Ascriptin A/D Dolene Mobidin Tanderil  Ascriptin Extra Strength Dolobid Moblgesic Ticlid  Ascriptin with Codeine  Doloprin or Doloprin with Codeine Momentum Tolectin  Asperbuf Duoprin Mono-gesic Trendar  Aspergum Duradyne Motrin or Motrin IB Triminicin  Aspirin plain, buffered or enteric coated Durasal Myochrisine Trigesic  Aspirin Suppositories Easprin Nalfon Trillsate  Aspirin with Codeine Ecotrin Regular or Extra Strength Naprosyn Uracel  Atromid-S Efficin Naproxen Ursinus  Auranofin Capsules Elmiron Neocylate Vanquish  Axotal Emagrin Norgesic Verin  Azathioprine Empirin or Empirin with Codeine Normiflo Vitamin E  Azolid Emprazil Nuprin Voltaren  Bayer Aspirin plain, buffered or children's or timed BC Tablets or powders Encaprin Orgaran Warfarin Sodium  Buff-a-Comp Enoxaparin Orudis Zorpin  Buff-a-Comp with Codeine Equegesic Os-Cal-Gesic   Buffaprin Excedrin plain, buffered or Extra Strength Oxalid   Bufferin Arthritis Strength Feldene Oxphenbutazone   Bufferin plain or Extra Strength Feldene Capsules Oxycodone with Aspirin   Bufferin with Codeine Fenoprofen Fenoprofen Pabalate or Pabalate-SF   Buffets II Flogesic Panagesic   Buffinol plain or Extra Strength Florinal or Florinal with Codeine Panwarfarin   Buf-Tabs Flurbiprofen Penicillamine   Butalbital Compound Four-way cold tablets Penicillin   Butazolidin Fragmin Pepto-Bismol   Carbenicillin Geminisyn Percodan   Carna Arthritis Reliever Geopen Persantine   Carprofen Gold's salt Persistin   Chloramphenicol Goody's Phenylbutazone   Chloromycetin Haltrain Piroxlcam   Clmetidine heparin Plaquenil   Cllnoril Hyco-pap Ponstel   Clofibrate Hydroxy chloroquine Propoxyphen         Before stopping any of these medications, be sure to consult the physician who ordered them.  Some, such as Coumadin (Warfarin) are ordered to prevent or treat serious conditions such as "deep thrombosis", "pumonary embolisms", and other heart problems.  The amount of time that you may need off of the medication may also vary with the medication and the reason for which  you were taking it.  If you are taking any of these medications, please make sure you notify your pain physician before you undergo any procedures.

## 2015-07-30 NOTE — Progress Notes (Signed)
Patient's Name: Louis Wells  Patient type: Established  MRN: [id number]  Service setting: Ambulatory outpatient  DOB: 1932-11-27  Location: [facility]  DOS: 07/30/2015  Primary Care Physician: No primary care provider on file.  Note by: Lakima Dona A. Laban Emperor, M.D, DABA, DABAPM, DABPM, DABIPP, FIPP  Referring Physician: No ref. provider found  Specialty: Board-Certified Interventional Pain Management  Last Visit to Pain Management: 07/03/2015   Primary Reason(s) for Visit: Encounter for evaluation before starting new chronic pain management plan of care (Level of risk: moderate) CC: Ankle Pain; Hip Pain; and Knee Pain   HPI  Louis Wells is a 82 y.o. year old, male patient, who returns today as an established patient. He has Atrial fibrillation (HCC); Acute myocardial infarction of anterior wall (HCC); Arteriosclerosis of coronary artery; Chronic kidney disease (CKD), stage III (moderate); Long term current use of opiate analgesic; History of cardiac catheterization; BP (high blood pressure); HLD (hyperlipidemia); Adult hypothyroidism; Chronic pain; Long term prescription opiate use; Opiate use (10 MME/Day); Encounter for therapeutic drug level monitoring; Encounter for pain management planning; Disturbance of skin sensation; Chronic hip pain (Location of Primary Source of Pain) (Right); Chronic ankle pain (Location of Tertiary source of pain) (Bilateral) (L>R); Chronic knee pain (Location of Secondary source of pain) (Bilateral) (R>L); Osteoarthritis of hip (Location of Primary Source of Pain) (Right); Osteoarthritis of ankle (Location of Tertiary source of pain) (Bilateral) (L>R); Osteoarthritis of knee (Location of Secondary source of pain) (Right); and Avascular necrosis of femur head (HCC) (Right) on his problem list.. His primarily concern today is the Ankle Pain; Hip Pain; and Knee Pain   Pain Assessment: Self-Reported Pain Score: 4 , clinically he looks like a  1-2/10. However, he is not ambulating. He is in a wheelchair. He indicates that when he puts pressure on that hip. Increases the pain significantly. Reported level is compatible with observation       Pain Type: Chronic pain Pain Location: Hip Pain Orientation: Right Pain Descriptors / Indicators: Sharp Pain Frequency: Constant  The patient comes into the clinics today for post-procedure evaluation on the interventional treatment done on 07/03/2015. In addition, he comes in today for pharmacological management of his chronic pain.  The patient  reports that he does not use illicit drugs.  Date of Last Visit: 07/03/15 Service Provided on Last Visit: Evaluation (new patient)  Controlled Substance Pharmacotherapy Assessment & REMS (Risk Evaluation and Mitigation Strategy)  Analgesic: Hydrocodone/APAP 5/325 one twice a day when necessary. Pill Count: The patient indicates having ran out of his home medications. MME/day: 10 mg/day.  Pharmacokinetics: Onset of action (Liberation/Absorption): Within expected pharmacological parameters Time to Peak effect (Distribution): Timing and results are as within normal expected parameters Duration of action (Metabolism/Excretion): Within normal limits for medication Pharmacodynamics: Analgesic Effect: More than 50% Activity Facilitation: Medication(s) allow patient to sit, stand, walk, and do the basic ADLs Perceived Effectiveness: Described as relatively effective, allowing for increase in activities of daily living (ADL) Side-effects or Adverse reactions: None reported Monitoring: [place] PMP: Online review of the past 62-month period conducted. Compliant with practice rules and regulations List of UDS test(s) done: Lab Results  Component Value Date   TOXASSSELUR FINAL 07/03/2015   Last UDS test ordered: TOXASSURE SELECT 13  Date Value Ref Range Status  07/03/2015 FINAL  Final    Comment:     ==================================================================== TOXASSURE SELECT 13 (MW) ==================================================================== Test  Result       Flag       Units Drug Present and Declared for Prescription Verification   Hydrocodone                    432          EXPECTED   ng/mg creat   Dihydrocodeine                 65           EXPECTED   ng/mg creat   Norhydrocodone                 537          EXPECTED   ng/mg creat    Sources of hydrocodone include scheduled prescription    medications. Dihydrocodeine and norhydrocodone are expected    metabolites of hydrocodone. Dihydrocodeine is also available as a    scheduled prescription medication. ==================================================================== Test                      Result    Flag   Units      Ref Range   Creatinine              171              mg/dL      >=16>=20 ==================================================================== Declared Medications:  The flagging and interpretation on this report are based on the  following declared medications.  Unexpected results may arise from  inaccuracies in the declared medications.  **Note: The testing scope of this panel includes these medications:  Hydrocodone (Hydrocodone-Acetaminophen)  **Note: The testing scope of this panel does not include following  reported medications:  Acetaminophen (Hydrocodone-Acetaminophen)  Apixaban (Eliquis)  Cyanocobalamin  Diclofenac (Voltaren)  Famotidine (Pepcid)  Levothyroxine  Lisinopril  Meloxicam (Mobic)  Metoprolol  Nitroglycerin ==================================================================== For clinical consultation, please call [phone]. ====================================================================    UDS interpretation: Compliant Medication Assessment Form: Reviewed. Patient indicates being compliant with therapy Treatment compliance:  Compliant Risk Assessment: Aberrant Behavior: None observed today Substance Use Disorder (SUD) Risk Level: Low Risk of opioid abuse or dependence: 0.7-3.0% with doses ? 36 MME/day and 6.1-26% with doses ? 120 MME/day. Opioid Risk Tool (ORT) Score: Total Score: 0 Low Risk for SUD (Score <3) Depression Scale Score: PHQ-2: PHQ-2 Total Score: 0 No depression (0) PHQ-9: PHQ-9 Total Score: 0 No depression (0-4)  Pharmacologic Plan: Today we'll take over his medication management and we will discontinue the hydrocodone/APAP and start him on oxycodone IR 5 mg twice a day. This should eliminate the acetaminophen and therefore make it safer for his liver.  Previous Illicit Drug Screen Labs(s): No results found for: MDMA, COCAINSCRNUR, PCPSCRNUR, [facility]  Laboratory Chemistry  Inflammation Markers Lab Results  Component Value Date   ESRSEDRATE 26* 07/03/2015   CRP 0.7 07/03/2015    Renal Function Lab Results  Component Value Date   BUN 32* 07/03/2015   CREATININE 1.45* 07/03/2015   GFRAA 51* 07/03/2015   GFRNONAA 44* 07/03/2015    Hepatic Function Lab Results  Component Value Date   AST 22 07/03/2015   ALT 11* 07/03/2015   ALBUMIN 4.2 07/03/2015    Electrolytes Lab Results  Component Value Date   NA 137 07/03/2015   K 5.3* 07/03/2015   CL 106 07/03/2015   CALCIUM 9.0 07/03/2015   MG 2.1 07/03/2015    Pain Modulating Vitamins Lab Results  Component Value Date  [id number] 806 07/03/2015    Coagulation Parameters No results found for: INR, LABPROT, APTT, PLT  Note: Labs Reviewed.. The patient was informed of the abnormalities with regards to his lab work and he was instructed to immediately go to his primary care physician to have this looked at. The patient appears to be having some chronic kidney problems.  Recent Diagnostic Imaging  Dg Ankle Complete Left  07/04/2015  CLINICAL DATA:  Bilateral ankle pain for the past 5 DIS 6 months; history of arthritis ; no known  injury EXAM: LEFT ANKLE COMPLETE - 3+ VIEW COMPARISON:  None in PACs FINDINGS: The bones are reasonably well mineralized for age. There is severe narrowing of the joint mortise in its mid and lateral portions. There is no acute or healing fracture. The articulation of the distal fibula with the lateral margin of the talus exhibits some sclerosis with joint space loss. No acute or healing malleolar fracture is observed. The talar dome exhibits no evidence of collapse. The calcaneus exhibits no acute abnormality. There is a plantar calcaneal spur. There are vascular calcifications. IMPRESSION: Moderate to severe osteoarthritic change of the tibiotalar and fibulotalar articulations. There is no acute fracture nor dislocation. Electronically Signed   By: David  [place] M.D.   On: 07/04/2015 08:16   Dg Ankle Complete Right  07/04/2015  CLINICAL DATA:  Chronic ankle pain. EXAM: RIGHT ANKLE - COMPLETE 3+ VIEW COMPARISON:  No prior. FINDINGS: Soft tissue swelling noted over the medial malleolus. No evidence of fracture dislocation. Diffuse degenerative change. Peripheral vascular calcification. IMPRESSION: 1. Soft tissue swelling over the medial malleolus. No acute bony abnormality. Degenerative changes noted about the left ankle. 2. Peripheral vascular disease. Electronically Signed   By: Maisie Fus  Register   On: 07/04/2015 08:17   Note: Imaging reviewed. Results explained to patient in Layman's terms.[name] "Care everywhere" shows that he had some x-rays of his knees and hip done recently had a [facility] facility. They show severe degenerative changes.  Meds  The patient has a current medication list which includes the following prescription(s): acetaminophen, apixaban, cyanocobalamin, famotidine, levothyroxine, lisinopril, metoprolol, nitroglycerin, oxycodone, and oxycodone.  Current Outpatient Prescriptions on File Prior to Visit  Medication Sig  . apixaban (ELIQUIS) 5 MG TABS tablet Take 5 mg by mouth 2 (two) times  daily.  . Cyanocobalamin (VITAMIN B 12 PO) Take 500 mg by mouth daily.  . famotidine (PEPCID) 20 MG tablet Take 20 mg by mouth 2 (two) times daily.  [name] levothyroxine (SYNTHROID, LEVOTHROID) 100 MCG tablet Take 100 mcg by mouth daily before breakfast.  . lisinopril (PRINIVIL,ZESTRIL) 20 MG tablet Take 20 mg by mouth daily.  . metoprolol (LOPRESSOR) 100 MG tablet Take 100 mg by mouth daily.  . nitroGLYCERIN (NITROSTAT) 0.4 MG SL tablet Place under the tongue.   No current facility-administered medications on file prior to visit.    ROS  Constitutional: Denies any fever or chills Gastrointestinal: No reported hemesis, hematochezia, vomiting, or acute GI distress Musculoskeletal: Denies any acute onset joint swelling, redness, loss of ROM, or weakness Neurological: No reported episodes of acute onset apraxia, aphasia, dysarthria, agnosia, amnesia, paralysis, loss of coordination, or loss of consciousness  Allergies  Mr. Simonian is allergic to contrast media and amoxicillin.  PFSH  Medical:  Mr. Longton  has a past medical history of Hyperlipidemia; Hypertension; Atrial fibrillation (HCC); Myocardial infarction (HCC); Thyroid disease; Primary osteoarthritis of right knee (05/22/2015); Degenerative arthritis of hip (04/05/2015); and Arthralgia of ankle or foot (11/20/2014). Family: family history  includes Cancer in his mother; Diabetes in his father. Surgical:  has past surgical history that includes Coronary angioplasty with stent. Tobacco:  reports that he has quit smoking. He does not have any smokeless tobacco history on file. Alcohol:  reports that he does not drink alcohol. Drug:  reports that he does not use illicit drugs.  Constitutional Exam  Vitals: Blood pressure 85/54, pulse 83, temperature 98.6 F (37 C), resp. rate 18, height 6' (1.829 m), weight 193 lb (87.544 kg), SpO2 98 %. General appearance: Well nourished, well developed, and well hydrated. In no acute distress Calculated  BMI/Body habitus: Body mass index is 26.17 kg/(m^2). (25-29.9 kg/m2) Overweight - 20% higher incidence of chronic pain Psych/Mental status: Alert and oriented x 3 (person, place, & time) Eyes: PERLA Respiratory: No evidence of acute respiratory distress  Cervical Spine Exam  Inspection: No masses, redness, or swelling Alignment: Symmetrical ROM: Functional: ROM is within functional limits [facility]) Stability: No instability detected Muscle strength & Tone: Functionally intact Sensory: Unimpaired Palpation: No complaints of tenderness  Upper Extremity (UE) Exam    Side: Right upper extremity  Side: Left upper extremity  Inspection: No masses, redness, swelling, or asymmetry  Inspection: No masses, redness, swelling, or asymmetry  ROM:  ROM:  Functional: ROM is within functional limits [facility])  Functional: ROM is within functional limits [facility])  Muscle strength & Tone: Functionally intact  Muscle strength & Tone: Functionally intact  Sensory: Unimpaired  Sensory: Unimpaired  Palpation: Non-contributory  Palpation: Non-contributory   Thoracic Spine Exam  Inspection: No masses, redness, or swelling Alignment: Symmetrical ROM: Functional: ROM is within functional limits [facility]) Stability: No instability detected Sensory: Unimpaired Muscle strength & Tone: Functionally intact Palpation: No complaints of tenderness  Lumbar Spine Exam  Inspection: No masses, redness, or swelling Alignment: Symmetrical ROM: Functional: ROM is within functional limits [facility]) Stability: No instability detected Muscle strength & Tone: Functionally intact Sensory: Unimpaired Palpation: No complaints of tenderness Provocative Tests: Lumbar Hyperextension and rotation test: deferred Patrick's Maneuver: deferred  Gait & Posture Assessment  Ambulation: Unassisted Gait: Unaffected Posture: WNL  Lower Extremity Exam    Side: Right lower extremity  Side: Left lower extremity  Inspection: No masses, redness,  swelling, or asymmetry ROM:  Inspection: No masses, redness, swelling, or asymmetry ROM:  Functional: ROM is within functional limits [facility])  Functional: ROM is within functional limits [facility])  Muscle strength & Tone: Functionally intact  Muscle strength & Tone: Functionally intact  Sensory: Unimpaired  Sensory: Unimpaired  Palpation: Non-contributory  Palpation: Non-contributory   Assessment & Plan  Primary Diagnosis & Pertinent Problem List: The primary encounter diagnosis was Chronic pain. Diagnoses of Long term current use of opiate analgesic, Chronic right hip pain, Chronic knee pain (Bilateral) (R>L), Primary osteoarthritis of right hip, Primary osteoarthritis of both knees, Primary osteoarthritis of right knee, and Avascular necrosis of femur head (HCC) (Right) were also pertinent to this visit.  Visit Diagnosis: 1. Chronic pain   2. Long term current use of opiate analgesic   3. Chronic right hip pain   4. Chronic knee pain (Bilateral) (R>L)   5. Primary osteoarthritis of right hip   6. Primary osteoarthritis of both knees   7. Primary osteoarthritis of right knee   8. Avascular necrosis of femur head (HCC) (Right)     Problems updated and reviewed during this visit: Problem  Osteoarthritis of knee (Location of Secondary source of pain) (Right)   Severe degenerative changes, primarily involving all compartments with 100%  Lateral joint space narrowing as well as 90% medial joint space narrowing. There are  large osteophyte along the medial as well as lateral aspect of the right  knee. There is also moderate osteophyte formation along the posterior  aspect of the right patella along the superior and inferior portion..  Overall alignment is significant valgus.    Avascular necrosis of femur head (HCC) (Right)  Chronic hip pain (Location of Primary Source of Pain) (Right)  Chronic ankle pain (Location of Tertiary source of pain) (Bilateral) (L>R)  Chronic knee pain (Location of  Secondary source of pain) (Bilateral) (R>L)  Osteoarthritis of hip (Location of Primary Source of Pain) (Right)   Severe degenerative changes along the superior as well as the inferior aspect of the acetabulum with complete loss of joint space. Is osteophyte formation along the superior work as well as inferior aspect of the femoral head, with some signs of AVN in the femoral head.    Osteoarthritis of ankle (Location of Tertiary source of pain) (Bilateral) (L>R)    Problem-specific Plan(s): No problem-specific assessment & plan notes found for this encounter.  No new assessment & plan notes have been filed under this hospital service since the last note was generated. Service: Pain Management   Plan of Care   Problem List Items Addressed This Visit      High   Avascular necrosis of femur head (HCC) (Right) (Chronic)   Chronic hip pain (Location of Primary Source of Pain) (Right) (Chronic)   Chronic knee pain (Location of Secondary source of pain) (Bilateral) (R>L) (Chronic)   Relevant Medications   acetaminophen (TYLENOL) 325 MG tablet   oxyCODONE (OXY IR/ROXICODONE) 5 MG immediate release tablet   oxyCODONE (OXY IR/ROXICODONE) 5 MG immediate release tablet   Other Relevant Orders   GENICULAR NERVE BLOCK   KNEE INJECTION   Chronic pain - Primary (Chronic)   Relevant Medications   acetaminophen (TYLENOL) 325 MG tablet   oxyCODONE (OXY IR/ROXICODONE) 5 MG immediate release tablet   oxyCODONE (OXY IR/ROXICODONE) 5 MG immediate release tablet   Osteoarthritis of hip (Location of Primary Source of Pain) (Right) (Chronic)   Relevant Medications   acetaminophen (TYLENOL) 325 MG tablet   oxyCODONE (OXY IR/ROXICODONE) 5 MG immediate release tablet   oxyCODONE (OXY IR/ROXICODONE) 5 MG immediate release tablet   Other Relevant Orders   HIP INJECTION   Osteoarthritis of knee (Location of Secondary source of pain) (Right)   Relevant Medications   acetaminophen (TYLENOL) 325 MG tablet    oxyCODONE (OXY IR/ROXICODONE) 5 MG immediate release tablet   oxyCODONE (OXY IR/ROXICODONE) 5 MG immediate release tablet   Other Relevant Orders   GENICULAR NERVE BLOCK   KNEE INJECTION     Medium   Long term current use of opiate analgesic (Chronic)    Other Visit Diagnoses    Primary osteoarthritis of both knees  (Chronic)           Pharmacotherapy (Medications Ordered): Meds ordered this encounter  Medications  . oxyCODONE (OXY IR/ROXICODONE) 5 MG immediate release tablet    Sig: Take 1 tablet (5 mg total) by mouth 2 (two) times daily as needed for severe pain.    Dispense:  60 tablet    Refill:  0    Do not add this medication to the electronic "Automatic Refill" notification system. Patient may have prescription filled one day early if pharmacy is closed on scheduled refill date. Do not fill until: 07/30/15 To last until: 08/29/15  .  oxyCODONE (OXY IR/ROXICODONE) 5 MG immediate release tablet    Sig: Take 1 tablet (5 mg total) by mouth 2 (two) times daily as needed for severe pain.    Dispense:  60 tablet    Refill:  0    Do not add this medication to the electronic "Automatic Refill" notification system. Patient may have prescription filled one day early if pharmacy is closed on scheduled refill date. Do not fill until: 08/29/15 To last until: 09/28/15    [facility] & Procedure Ordered: Orders Placed This Encounter  Procedures  . HIP INJECTION  . GENICULAR NERVE BLOCK  . KNEE INJECTION    Imaging Ordered: None  Interventional Therapies: Scheduled:  Diagnostic right-sided intra-articular hip joint injection under fluoroscopic guidance, without sedation. Stop Elaquis 3 days prior to procedure.    Considering:   1. Diagnostic right intra-articular hip joint injection under fluoroscopic guidance, without IV sedation.  2. Possible right hip joint radiofrequency ablation under fluoroscopic guidance and IV sedation.  3. Diagnostic intra-articular right knee  injection, without fluoroscopy or IV sedation.  4. Diagnostic right-sided genicular nerve block under fluoroscopic guidance and IV sedation.  5. Possible right sided genicular nerve radiofrequency ablation under fluoroscopic guidance and IV sedation.    PRN Procedures:   1. Diagnostic right intra-articular hip joint injection under fluoroscopic guidance, without IV sedation.  2. Diagnostic intra-articular right knee injection, without fluoroscopy or IV sedation.  3. Diagnostic right-sided genicular nerve block under fluoroscopic guidance and IV sedation.    Referral(s) or Consult(s): None at this time.  New Prescriptions   OXYCODONE (OXY IR/ROXICODONE) 5 MG IMMEDIATE RELEASE TABLET    Take 1 tablet (5 mg total) by mouth 2 (two) times daily as needed for severe pain.   OXYCODONE (OXY IR/ROXICODONE) 5 MG IMMEDIATE RELEASE TABLET    Take 1 tablet (5 mg total) by mouth 2 (two) times daily as needed for severe pain.    Medications administered during this visit: Mr. Dues had no medications administered during this visit.  Requested PM Follow-up: Return for Procedure (Scheduled), Medication Management, (2-Mo).  Future Appointments Date Time Provider Department Center  08/07/2015 11:00 AM Delano Metz, MD [facility] None  09/27/2015 10:00 AM Delano Metz, MD [facility] None    Primary Care Physician: No primary care provider on file. Location: [facility] Note by: Shacara Cozine A. Laban Emperor, M.D, DABA, DABAPM, DABPM, DABIPP, FIPP  Pain Score Disclaimer: We use the NRS-11 scale. This is a self-reported, subjective measurement of pain severity with only modest accuracy. It is used primarily to identify changes within a particular patient. It must be understood that outpatient pain scales are significantly less accurate that those used for research, where they can be applied under ideal controlled circumstances with minimal exposure to variables. In reality, the  score is likely to be a combination of pain intensity and pain affect, where pain affect describes the degree of emotional arousal or changes in action readiness caused by the sensory experience of pain. Factors such as social and work situation, setting, emotional state, anxiety levels, expectation, and prior pain experience may influence pain perception and show large inter-individual differences that may also be affected by time variables.  Patient instructions provided during this appointment: Patient Instructions   DO NOT EAT OR DRINK FOR 3 HOURS PRIOR TO PROCEDURE STOP ELIQUIS FOR 3 DAYS PRIOR TO PROCEDURE TAKE BLOOD PRESSURE MEDICATION THE MORNING OF PROCEDURE   GENERAL RISKS AND COMPLICATIONS  What are the risk, side effects and  possible complications? Generally speaking, most procedures are safe.  However, with any procedure there are risks, side effects, and the possibility of complications.  The risks and complications are dependent upon the sites that are lesioned, or the type of nerve block to be performed.  The closer the procedure is to the spine, the more serious the risks are.  Great care is taken when placing the radio frequency needles, block needles or lesioning probes, but sometimes complications can occur. 1. Infection: Any time there is an injection through the skin, there is a risk of infection.  This is why sterile conditions are used for these blocks.  There are four possible types of infection. 1. Localized skin infection. 2. Central Nervous System Infection-This can be in the form of Meningitis, which can be deadly. 3. Epidural Infections-This can be in the form of an epidural abscess, which can cause pressure inside of the spine, causing compression of the spinal cord with subsequent paralysis. This would require an emergency surgery to decompress, and there are no guarantees that the patient would recover from the paralysis. 4. Discitis-This is an infection of the  intervertebral discs.  It occurs in about 1% of discography procedures.  It is difficult to treat and it may lead to surgery.        2. Pain: the needles have to go through skin and soft tissues, will cause soreness.       3. Damage to internal structures:  The nerves to be lesioned may be near blood vessels or    other nerves which can be potentially damaged.       4. Bleeding: Bleeding is more common if the patient is taking blood thinners such as  aspirin, Coumadin, Ticiid, Plavix, etc., or if he/she have some genetic predisposition  such as hemophilia. Bleeding into the spinal canal can cause compression of the spinal  cord with subsequent paralysis.  This would require an emergency surgery to  decompress and there are no guarantees that the patient would recover from the  paralysis.       5. Pneumothorax:  Puncturing of a lung is a possibility, every time a needle is introduced in  the area of the chest or upper back.  Pneumothorax refers to free air around the  collapsed lung(s), inside of the thoracic cavity (chest cavity).  Another two possible  complications related to a similar event would include: Hemothorax and Chylothorax.   These are variations of the Pneumothorax, where instead of air around the collapsed  lung(s), you may have blood or chyle, respectively.       6. Spinal headaches: They may occur with any procedures in the area of the spine.       7. Persistent CSF (Cerebro-Spinal Fluid) leakage: This is a rare problem, but may occur  with prolonged intrathecal or epidural catheters either due to the formation of a fistulous  track or a dural tear.       8. Nerve damage: By working so close to the spinal cord, there is always a possibility of  nerve damage, which could be as serious as a permanent spinal cord injury with  paralysis.       9. Death:  Although rare, severe deadly allergic reactions known as "Anaphylactic  reaction" can occur to any of the medications used.       10. Worsening of the symptoms:  We can always make thing worse.  What are the chances of something like this happening? Chances  of any of this occuring are extremely low.  By statistics, you have more of a chance of getting killed in a motor vehicle accident: while driving to the hospital than any of the above occurring .  Nevertheless, you should be aware that they are possibilities.  In general, it is similar to taking a shower.  Everybody knows that you can slip, hit your head and get killed.  Does that mean that you should not shower again?  Nevertheless always keep in mind that statistics do not mean anything if you happen to be on the wrong side of them.  Even if a procedure has a 1 (one) in a 1,000,000 (million) chance of going wrong, it you happen to be that one..Also, keep in mind that by statistics, you have more of a chance of having something go wrong when taking medications.  Who should not have this procedure? If you are on a blood thinning medication (e.g. Coumadin, Plavix, see list of "Blood Thinners"), or if you have an active infection going on, you should not have the procedure.  If you are taking any blood thinners, please inform your physician.  How should I prepare for this procedure?  Do not eat or drink anything at least six hours prior to the procedure.  Bring a driver with you .  It cannot be a taxi.  Come accompanied by an adult that can drive you back, and that is strong enough to help you if your legs get weak or numb from the local anesthetic.  Take all of your medicines the morning of the procedure with just enough water to swallow them.  If you have diabetes, make sure that you are scheduled to have your procedure done first thing in the morning, whenever possible.  If you have diabetes, take only half of your insulin dose and notify our nurse that you have done so as soon as you arrive at the clinic.  If you are diabetic, but only take blood sugar pills (oral  hypoglycemic), then do not take them on the morning of your procedure.  You may take them after you have had the procedure.  Do not take aspirin or any aspirin-containing medications, at least eleven (11) days prior to the procedure.  They may prolong bleeding.  Wear loose fitting clothing that may be easy to take off and that you would not mind if it got stained with Betadine or blood.  Do not wear any jewelry or perfume  Remove any nail coloring.  It will interfere with some of our monitoring equipment.  NOTE: Remember that this is not meant to be interpreted as a complete list of all possible complications.  Unforeseen problems may occur.  BLOOD THINNERS The following drugs contain aspirin or other products, which can cause increased bleeding during surgery and should not be taken for 2 weeks prior to and 1 week after surgery.  If you should need take something for relief of minor pain, you may take acetaminophen which is found in Tylenol,m Datril, Anacin-3 and Panadol. It is not blood thinner. The products listed below are.  Do not take any of the products listed below in addition to any listed on your instruction sheet.  A.P.C or A.P.C with Codeine Codeine Phosphate Capsules #3 Ibuprofen Ridaura  ABC compound Congesprin Imuran rimadil  Advil Cope Indocin Robaxisal  Alka-Seltzer Effervescent Pain Reliever and Antacid Coricidin or Coricidin-D  Indomethacin Rufen  Alka-Seltzer plus Cold Medicine Cosprin Ketoprofen S-A-C Tablets  Anacin Analgesic  Tablets or Capsules Coumadin Korlgesic Salflex  Anacin Extra Strength Analgesic tablets or capsules CP-2 Tablets Lanoril Salicylate  Anaprox Cuprimine Capsules Levenox Salocol  Anexsia-D Dalteparin Magan Salsalate  Anodynos Darvon compound Magnesium Salicylate Sine-off  Ansaid Dasin Capsules Magsal Sodium Salicylate  Anturane Depen Capsules Marnal Soma  APF Arthritis pain formula Dewitt's Pills Measurin Stanback  Argesic Dia-Gesic Meclofenamic  Sulfinpyrazone  Arthritis Bayer Timed Release Aspirin Diclofenac Meclomen Sulindac  Arthritis pain formula Anacin Dicumarol Medipren Supac  Analgesic (Safety coated) Arthralgen Diffunasal Mefanamic Suprofen  Arthritis Strength Bufferin Dihydrocodeine Mepro Compound Suprol  Arthropan liquid Dopirydamole Methcarbomol with Aspirin Synalgos  ASA tablets/Enseals Disalcid Micrainin Tagament  Ascriptin Doan's Midol Talwin  Ascriptin A/D Dolene Mobidin Tanderil  Ascriptin Extra Strength Dolobid Moblgesic Ticlid  Ascriptin with Codeine Doloprin or Doloprin with Codeine Momentum Tolectin  Asperbuf Duoprin Mono-gesic Trendar  Aspergum Duradyne Motrin or Motrin IB Triminicin  Aspirin plain, buffered or enteric coated Durasal Myochrisine Trigesic  Aspirin Suppositories Easprin Nalfon Trillsate  Aspirin with Codeine Ecotrin Regular or Extra Strength Naprosyn Uracel  Atromid-S Efficin Naproxen Ursinus  Auranofin Capsules Elmiron Neocylate Vanquish  Axotal Emagrin Norgesic Verin  Azathioprine Empirin or Empirin with Codeine Normiflo Vitamin E  Azolid Emprazil Nuprin Voltaren  Bayer Aspirin plain, buffered or children's or timed BC Tablets or powders Encaprin Orgaran Warfarin Sodium  Buff-a-Comp Enoxaparin Orudis Zorpin  Buff-a-Comp with Codeine Equegesic Os-Cal-Gesic   Buffaprin Excedrin plain, buffered or Extra Strength Oxalid   Bufferin Arthritis Strength Feldene Oxphenbutazone   Bufferin plain or Extra Strength Feldene Capsules Oxycodone with Aspirin   Bufferin with Codeine Fenoprofen Fenoprofen Pabalate or Pabalate-SF   Buffets II Flogesic Panagesic   Buffinol plain or Extra Strength Florinal or Florinal with Codeine Panwarfarin   Buf-Tabs Flurbiprofen Penicillamine   Butalbital Compound Four-way cold tablets Penicillin   Butazolidin Fragmin Pepto-Bismol   Carbenicillin Geminisyn Percodan   Carna Arthritis Reliever Geopen Persantine   Carprofen Gold's salt Persistin   Chloramphenicol Goody's  Phenylbutazone   Chloromycetin Haltrain Piroxlcam   Clmetidine heparin Plaquenil   Cllnoril Hyco-pap Ponstel   Clofibrate Hydroxy chloroquine Propoxyphen         Before stopping any of these medications, be sure to consult the physician who ordered them.  Some, such as Coumadin (Warfarin) are ordered to prevent or treat serious conditions such as "deep thrombosis", "pumonary embolisms", and other heart problems.  The amount of time that you may need off of the medication may also vary with the medication and the reason for which you were taking it.  If you are taking any of these medications, please make sure you notify your pain physician before you undergo any procedures.

## 2015-07-30 NOTE — Progress Notes (Signed)
Safety precautions to be maintained throughout the outpatient stay will include: orient to surroundings, keep bed in low position, maintain call bell within reach at all times, provide assistance with transfer out of bed and ambulation.  

## 2015-08-03 ENCOUNTER — Ambulatory Visit: Payer: Medicare Other | Admitting: Anesthesiology

## 2015-08-07 ENCOUNTER — Encounter: Payer: Self-pay | Admitting: Pain Medicine

## 2015-08-07 ENCOUNTER — Ambulatory Visit: Payer: Medicare Other | Attending: Pain Medicine | Admitting: Pain Medicine

## 2015-08-07 ENCOUNTER — Telehealth: Payer: Self-pay | Admitting: *Deleted

## 2015-08-07 VITALS — BP 147/92 | HR 86 | Temp 98.5°F | Resp 16 | Ht 72.0 in | Wt 193.0 lb

## 2015-08-07 DIAGNOSIS — Z79891 Long term (current) use of opiate analgesic: Secondary | ICD-10-CM | POA: Insufficient documentation

## 2015-08-07 DIAGNOSIS — E039 Hypothyroidism, unspecified: Secondary | ICD-10-CM | POA: Diagnosis not present

## 2015-08-07 DIAGNOSIS — M879 Osteonecrosis, unspecified: Secondary | ICD-10-CM | POA: Insufficient documentation

## 2015-08-07 DIAGNOSIS — I2109 ST elevation (STEMI) myocardial infarction involving other coronary artery of anterior wall: Secondary | ICD-10-CM | POA: Diagnosis not present

## 2015-08-07 DIAGNOSIS — M1611 Unilateral primary osteoarthritis, right hip: Secondary | ICD-10-CM | POA: Diagnosis not present

## 2015-08-07 DIAGNOSIS — M25551 Pain in right hip: Secondary | ICD-10-CM

## 2015-08-07 DIAGNOSIS — E785 Hyperlipidemia, unspecified: Secondary | ICD-10-CM | POA: Diagnosis not present

## 2015-08-07 DIAGNOSIS — R200 Anesthesia of skin: Secondary | ICD-10-CM | POA: Insufficient documentation

## 2015-08-07 DIAGNOSIS — M19071 Primary osteoarthritis, right ankle and foot: Secondary | ICD-10-CM | POA: Insufficient documentation

## 2015-08-07 DIAGNOSIS — M1711 Unilateral primary osteoarthritis, right knee: Secondary | ICD-10-CM | POA: Diagnosis not present

## 2015-08-07 DIAGNOSIS — I4891 Unspecified atrial fibrillation: Secondary | ICD-10-CM | POA: Insufficient documentation

## 2015-08-07 DIAGNOSIS — M25562 Pain in left knee: Secondary | ICD-10-CM | POA: Diagnosis not present

## 2015-08-07 DIAGNOSIS — G8929 Other chronic pain: Secondary | ICD-10-CM | POA: Diagnosis not present

## 2015-08-07 DIAGNOSIS — M25461 Effusion, right knee: Secondary | ICD-10-CM | POA: Insufficient documentation

## 2015-08-07 DIAGNOSIS — N183 Chronic kidney disease, stage 3 (moderate): Secondary | ICD-10-CM | POA: Insufficient documentation

## 2015-08-07 DIAGNOSIS — M87051 Idiopathic aseptic necrosis of right femur: Secondary | ICD-10-CM

## 2015-08-07 DIAGNOSIS — M25559 Pain in unspecified hip: Secondary | ICD-10-CM | POA: Diagnosis present

## 2015-08-07 DIAGNOSIS — I129 Hypertensive chronic kidney disease with stage 1 through stage 4 chronic kidney disease, or unspecified chronic kidney disease: Secondary | ICD-10-CM | POA: Insufficient documentation

## 2015-08-07 DIAGNOSIS — I2510 Atherosclerotic heart disease of native coronary artery without angina pectoris: Secondary | ICD-10-CM | POA: Diagnosis not present

## 2015-08-07 DIAGNOSIS — M19072 Primary osteoarthritis, left ankle and foot: Secondary | ICD-10-CM | POA: Insufficient documentation

## 2015-08-07 DIAGNOSIS — M25561 Pain in right knee: Secondary | ICD-10-CM | POA: Diagnosis not present

## 2015-08-07 DIAGNOSIS — M25569 Pain in unspecified knee: Secondary | ICD-10-CM | POA: Diagnosis present

## 2015-08-07 LAB — SYNOVIAL CELL COUNT + DIFF, W/ CRYSTALS
CRYSTALS FLUID: NONE SEEN
Eosinophils-Synovial: 0 %
LYMPHOCYTES-SYNOVIAL FLD: 53 %
Monocyte-Macrophage-Synovial Fluid: 34 %
Neutrophil, Synovial: 13 %
WBC, Synovial: 153 /mm3 (ref 0–200)

## 2015-08-07 MED ORDER — LIDOCAINE HCL (PF) 1 % IJ SOLN
10.0000 mL | Freq: Once | INTRAMUSCULAR | Status: AC
Start: 2015-08-07 — End: 2015-08-07
  Administered 2015-08-07: 10 mL

## 2015-08-07 MED ORDER — ROPIVACAINE HCL 2 MG/ML IJ SOLN
5.0000 mL | Freq: Once | INTRAMUSCULAR | Status: AC
  Administered 2015-08-07: 5 mL
  Filled 2015-08-07: qty 20

## 2015-08-07 MED ORDER — IOPAMIDOL (ISOVUE-M 200) INJECTION 41%: 10.0000 mL | Freq: Once | INTRAMUSCULAR | Status: DC

## 2015-08-07 MED ORDER — METHYLPREDNISOLONE ACETATE 40 MG/ML IJ SUSP
40.0000 mg | Freq: Once | INTRAMUSCULAR | Status: AC
  Administered 2015-08-07: 40 mg
  Filled 2015-08-07: qty 1

## 2015-08-07 NOTE — Patient Instructions (Signed)
Celiac Plexus Block Patient Information  Description: The celiac plexus is a group of nerves which are part of the sympathetic nervous system.  These nerves supply organs in the abdomen and pelvis.  Specific organs supplied with sensation by the celiac plexus include the stomach, liver, gallbladder, pancreas, kidneys and part of the gut.   The celiac plexus is located on both sides of the aorta at approximately the level of the first lumbar vertebral body.  The block will be performed with you lying on your abdomen with a pillow underneath.  Using direct x-ray guidance, the celiac plexus will be located on both sides of the spine.  Numbing medicine will be used to deaden the skin prior to needle insertion.  In most cases, a small amount of sedation can be given by IV prior to the numbing medicine.  Two small needles will be place near the celiac plexus and local anesthetic and steroid will be injected.  The entire block usually last about 15-25 minutes.  Conditions which may be treated by celiac plexus block:   Acute and chronic pancreatitis  Pain from liver or pancreatic cancer  Pain from Crohn's disease  Other types of abdominal or flank pain  Preparation for the injection:  1. Do not eat any solid food or dairy products within 8 hours of your appointment. 2. You may drink clear liquids up to 3 hours before appointment.  Clear liquids include water, black coffee, juice or soda.  No milk or cream please. 3. You may take your regular medication, including pain medications, with a sip of water before your appointment.  Diabetics should hold regular insulin (if taken separately) and take 1/2 normal NPH dose in the morning of the procedure.  Carry some sugar containing items with you to your appointment. 4. A driver must accompany you and be prepared to drive you home after your procedure. 5. Bring all your current medications with you. 6. An IV may be inserted and sedation may be given at the  discretion of the physician. 7. A blood pressure cuff, EKG, and other monitors will often be applied during the procedure.  Some patients may need to have extra oxygen administered for a short period. 8. You will be asked to provide medical information, including your allergies and medications, prior to the procedure.  We must know immediately if you are taking blood thinners (like Coumadin/Warfarin) or if you are allergic to IV iodine contrast (dye).  We must know if you could possible be pregnant.  Possible side-effects:   Bleeding from needle site or deeper  Infection (rarre, can require surgery)  Nerve injury (rare)  Numbness & tingling (temporary)  Collapsed lung (rare)  Spinal headache ( a headache worse with upright posture)  Light-headedness (temporary)  Pain at injection site (several days)  Decreased blood pressure (temporary)  Weakness in legs (temporary)  Seizure or other drug reaction (rare)  Call if you experience:   Fever/chills associated with headache or increased back/neck pain  Headache worsened by an upright position  New onset weakness or numbness of an extremity below the injection site.  Hives or difficulty breathing (go to the emergency room)  Inflammation or drainage at the injection site.  New onset diarrhea lasting more than 2 weeks.  New symptoms which are concerning to you  Please note:  If effective, we will often do a series of 2-3 injections spaced 3-6 weeks apart to maximally decrease your pain.  If initial series is effective, you may   be a candidate for a more permanent block of the celiac plexus. .  If you have questions, please call [phone] [facility]    Knee Injection A knee injection is a procedure to get medicine into your knee joint. Your health care provider puts a needle into the joint and injects medicine with an attached syringe. The injected medicine may relieve the pain,  swelling, and stiffness of arthritis. The injected medicine may also help to lubricate and cushion your knee joint. You may need more than one injection. LET [facility] KNOW ABOUT: 5. Any allergies you have. 6. All medicines you are taking, including vitamins, herbs, eye drops, creams, and over-the-counter medicines. 7. Previous problems you or members of your family have had with the use of anesthetics. 8. Any blood disorders you have. 9. Previous surgeries you have had. 10. Any medical conditions you may have. RISKS AND COMPLICATIONS Generally, this is a safe procedure. However, problems may occur, including:  Infection.  Bleeding.  Worsening symptoms.  Damage to the area around your knee.  Allergic reaction to any of the medicines.  Skin reactions from repeated injections. BEFORE THE PROCEDURE  Ask your health care provider about changing or stopping your regular medicines. This is especially important if you are taking diabetes medicines or blood thinners.  Plan to have someone take you home after the procedure. PROCEDURE 9. You will sit or lie down in a position for your knee to be treated. 10. The skin over your kneecap will be cleaned with a germ-killing solution (antiseptic). 11. You will be given a medicine that numbs the area (local anesthetic). You may feel some stinging. 12. After your knee becomes numb, you will have a second injection. This is the medicine. This needle is carefully placed between your kneecap and your knee. The medicine is injected into the joint space. 13. At the end of the procedure, the needle will be removed. 14. A bandage (dressing) may be placed over the injection site. The procedure may vary among health care providers and hospitals. AFTER THE PROCEDURE 12. You may have to move your knee through its full range of motion. This helps to get all of the medicine into your joint space. 13. Your blood pressure, heart rate, breathing  rate, and blood oxygen level will be monitored often until the medicines you were given have worn off. 14. You will be watched to make sure that you do not have a reaction to the injected medicine.   This information is not intended to replace advice given to you by your health care provider. Make sure you discuss any questions you have with your health care provider.   Document Released: 04/20/2006 Document Revised: 02/17/2014 Document Reviewed: 12/07/2013 [employer] Interactive Patient Education 2016 [employer] Pain Management Discharge Instructions  General Discharge Instructions :  If you need to reach your doctor call: Monday-Friday 8:00 am - 4:00 pm at [phone] or toll free [phone].  After clinic hours [phone] to have operator reach doctor.  Bring all of your medication bottles to all your appointments in the pain clinic.  To cancel or reschedule your appointment with Pain Management please remember to call 24 hours in advance to avoid a fee.  Refer to the educational materials which you have been given on: General Risks, I had my Procedure. Discharge Instructions, Post Sedation.  Post Procedure Instructions:  The drugs you were given will stay in your system until tomorrow, so for the  next 24 hours you should not drive, make any legal decisions or drink any alcoholic beverages.  You may eat anything you prefer, but it is better to start with liquids then soups and crackers, and gradually work up to solid foods.  Please notify your doctor immediately if you have any unusual bleeding, trouble breathing or pain that is not related to your normal pain.  Depending on the type of procedure that was done, some parts of your body may feel week and/or numb.  This usually clears up by tonight or the next day.  Walk with the use of an assistive device or accompanied by an adult for the 24 hours.  You may use ice on the affected area for the first 24 hours.  Put ice in a  Ziploc bag and cover with a towel and place against area 15 minutes on 15 minutes off.  You may switch to heat after 24 hours.

## 2015-08-07 NOTE — Telephone Encounter (Signed)
Pt is aware that [facility] will arrange for orthopedic surgery consult. Placed the order in [facility] box.[name].[name]

## 2015-08-07 NOTE — Progress Notes (Signed)
Patient's Name: Louis Wells  Patient type: Established  MRN: [id number]  Service setting: Ambulatory outpatient  DOB: 04/17/1932  Location: [facility]  DOS: 08/07/2015  Primary Care Physician: No primary care provider on file.  Note by: Ronan Duecker A. Dossie Arbour, M.D, DABA, DABAPM, DABPM, Milagros Evener, FIPP  Referring Physician: Milinda Pointer, MD  Specialty: Board-Certified Interventional Pain Management  Last Visit to Pain Management: 07/30/2015   Primary Reason(s) for Visit: Interventional Pain Management Treatment. CC: Hip Pain and Knee Pain  Primary Diagnosis: Chronic right hip pain [M25.551, G89.29]   Procedure #1:  Anesthesia, Analgesia, Anxiolysis:  Type: Diagnostic Intra-Articular Knee Injection Region: Lateral  Knee Region Level: Knee Joint Laterality: Right-Sided  Indications: 1. Chronic knee pain (Location of Secondary source of pain) (Bilateral) (R>L)   2. Primary osteoarthritis of right knee   3. Fluid in right knee joint    Pre-procedure Pain Score: 4/10 Reported level of pain is compatible with clinical observations Post-procedure Pain Score: 0-No pain  Type: Local Anesthesia Local Anesthetic: Lidocaine 1% Route: Infiltration ([place]/IM) IV Access: Declined Sedation: Declined  Indication(s): Analgesia      Procedure #2:  Anesthesia, Analgesia, Anxiolysis:  Type: Diagnostic Intra-Articular Hip Injection Region:  Posterolateral hip joint area. Level: Lower pelvic and hip joint level. Laterality: Right-Sided  Indications: 1. Chronic hip pain (Location of Primary Source of Pain) (Right)   2. Primary osteoarthritis of right hip   3. Avascular necrosis of femur head (HCC) (Right)     Type: Local Anesthesia Local Anesthetic: Lidocaine 1% Route: Infiltration ([place]/IM) IV Access: Declined Sedation: Declined  Indication(s): Analgesia     Pre-Procedure Assessment:  Mr. Hopping is a 82 y.o. year old, male patient, seen today for interventional  treatment. He has Atrial fibrillation ([place]); Acute myocardial infarction of anterior wall ([place]); Arteriosclerosis of coronary artery; Chronic kidney disease (CKD), stage III (moderate); Long term current use of opiate analgesic; History of cardiac catheterization; BP (high blood pressure); HLD (hyperlipidemia); Adult hypothyroidism; Chronic pain; Long term prescription opiate use; Opiate use (10 MME/Day); Encounter for therapeutic drug level monitoring; Encounter for pain management planning; Disturbance of skin sensation; Chronic hip pain (Location of Primary Source of Pain) (Right); Chronic ankle pain (Location of Tertiary source of pain) (Bilateral) (L>R); Chronic knee pain (Location of Secondary source of pain) (Bilateral) (R>L); Osteoarthritis of hip (Location of Primary Source of Pain) (Right); 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 His primarily concern today is the Hip Pain and Knee Pain   Pain Type: Chronic pain Pain Location: Hip (right hip and right knee) Pain Orientation: Right Pain Descriptors / Indicators: Sharp Pain Frequency: Constant  Date of Last Visit: 07/30/15 Service Provided on Last Visit: Evaluation (new patient)  Coagulation Parameters The patient stopped the Elaquis 3 days ago. Patient instructed to restart it 6-7 hours after procedure.  Verification of the correct person, correct site (including marking of site), and correct procedure were performed and confirmed by the patient.  Consent: Secured. Under the influence of no sedatives a written informed consent was obtained, after having provided information on the risks and possible complications. To fulfill our ethical and legal obligations, as recommended by the American Medical Association's Code of Ethics, we  have provided information to the patient about our clinical impression; the nature and purpose of the treatment or  procedure; the risks, benefits, and possible complications of the intervention; alternatives; the risk(s) and benefit(s) of the alternative treatment(s) or procedure(s); and the risk(s) and benefit(s) of doing nothing. The patient was provided information about the risks and possible complications associated with the procedure. These include, but are not limited to, failure to achieve desired goals, infection, bleeding, organ or nerve damage, allergic reactions, paralysis, and death. In the case of intra- or periarticular procedures these may include, but are not limited to, failure to achieve desired goals, infection, bleeding (hemarthrosis), organ or nerve damage, allergic reactions, and death. In addition, the patient was informed that Medicine is not an exact science; therefore, there is also the possibility of unforeseen risks and possible complications that may result in a catastrophic outcome. The patient indicated having understood very clearly. We have given the patient no guarantees and we have made no promises. Enough time was given to the patient to ask questions, all of which were answered to the patient's satisfaction.  Consent Attestation: I, the ordering provider, attest that I have discussed with the patient the benefits, risks, side-effects, alternatives, likelihood of achieving goals, and potential problems during recovery for the procedure that I have provided informed consent.  Pre-Procedure Preparation: Safety Precautions: Allergies reviewed. Appropriate site, procedure, and patient were confirmed by following the Joint Commission's Universal Protocol (UP.01.01.01), in the form of a "Time Out". The patient was asked to confirm marked site and procedure, before commencing. The patient was asked about blood thinners, or active infections, both of which were denied. Patient was  assessed for positional comfort and all pressure points were checked before starting procedure. Allergies: He is allergic to contrast media and amoxicillin.. Infection Control Precautions: Sterile technique used. Standard Universal Precautions were taken as recommended by the Department of [facility] for Disease Control and Prevention (CDC). Standard pre-surgical skin prep was conducted. Respiratory hygiene and cough etiquette was practiced. Hand hygiene observed. Safe injection practices and needle disposal techniques followed. SDV (single dose vial) medications used. Medications properly checked for expiration dates and contaminants. Personal protective equipment (PPE) used: Sterile Radiation-resistant gloves. Monitoring:  As per clinic protocol. Filed Vitals:   08/07/15 1245 08/07/15 1249 08/07/15 1252 08/07/15 1257  BP: 99/67 126/80 130/86 147/92  Pulse: 85 80 82 86  Temp:      Resp: _0 Height:      Weight:      SpO2: 100% 100% 100% 99%  Calculated BMI: Body mass index is 26.17 kg/(m^2).  Description of Procedure #1 Process:   Time-out: "Time-out" completed before starting procedure, as per protocol. Position: Sitting Target Area: Knee Joint Approach: Lateral approach. Area Prepped: Entire knee area, from the mid-thigh to the mid-shin. Prepping solution: ChloraPrep (2% chlorhexidine gluconate and 70% isopropyl alcohol) Safety Precautions: Aspiration looking for blood return was conducted prior to all injections. At no point did we inject any substances, as a needle was being advanced. No attempts were made at seeking any paresthesias. Safe injection practices and needle disposal techniques used. Medications properly checked for expiration dates. SDV (single dose vial) medications used. Latex Allergy precautions taken.    Description of the Procedure: Protocol guidelines were followed. The patient was placed in position over the fluoroscopy table. The target area was  identified and the area prepped in the usual manner. Skin desensitized using vapocoolant spray. Skin & deeper tissues infiltrated with local anesthetic. Appropriate amount of time allowed to pass for local anesthetics to take effect. The procedure needles  were then advanced to the target area. Proper needle placement secured. Negative aspiration confirmed. Solution injected in intermittent fashion, asking for systemic symptoms every 0.5cc of injectate. The needles were then removed and the area cleansed, making sure to leave some of the prepping solution back to take advantage of its long term bactericidal properties. EBL: None Materials & Medications Used:  Needle(s) Used: 22g - 1.5" Needle(s) Medications Administered today: We administered lidocaine (PF), ropivacaine (PF) 2 mg/ml (0.2%), methylPREDNISolone acetate, methylPREDNISolone acetate, and ropivacaine (PF) 2 mg/ml (0.2%).Please see chart orders for dosing details.  Imaging Guidance for procedure #1:   Type of Imaging Technique: None   Description of Procedure #2 Process:   Time-out: "Time-out" completed before starting procedure, as per protocol. Position: Prone Target Area: Superior aspect of the hip joint cavity, going thru the superior portion of the capsular ligament. Approach: Posterolateral approach. Area Prepped: Entire Posterolateral hip area. Prepping solution: ChloraPrep (2% chlorhexidine gluconate and 70% isopropyl alcohol) Safety Precautions: Aspiration looking for blood return was conducted prior to all injections. At no point did we inject any substances, as a needle was being advanced. No attempts were made at seeking any paresthesias. Safe injection practices and needle disposal techniques used. Medications properly checked for expiration dates. SDV (single dose vial) medications used. Contrast Allergy precautions taken.   Description of the Procedure: Protocol guidelines were followed. The patient was placed in position  over the fluoroscopy table. The target area was identified and the area prepped in the usual manner. Skin & deeper tissues infiltrated with local anesthetic. Appropriate amount of time allowed to pass for local anesthetics to take effect. The procedure needles were then advanced to the target area. Proper needle placement secured. Negative aspiration confirmed. Solution injected in intermittent fashion, asking for systemic symptoms every 0.5cc of injectate. The needles were then removed and the area cleansed, making sure to leave some of the prepping solution back to take advantage of its long term bactericidal properties. EBL: Minimal Materials & Medications Used:  Needle(s) Used: 22g - 5" Spinal Needle(s) Solution Injected: 0.2% PF-Ropivacaine (42m) + SDV-DepoMedrol 40 mg/ml (159m Medications Administered today: Mr. CaReifsteckad no medications administered during this visit.Please see chart orders for dosing details.  Imaging Guidance for procedure #2:   Type of Imaging Technique: Fluoroscopy Guidance (Non-spinal) Indication(s): Assistance in needle guidance and placement for procedures requiring needle placement in or near specific anatomical locations not easily accessible without such assistance. Exposure Time: Please see nurses notes. Contrast: Patient allergic to contrast dye. None used. Fluoroscopic Guidance: I was personally present in the fluoroscopy suite, where the patient was placed in position for the procedure, over the fluoroscopy-compatible table. Fluoroscopy was manipulated, using "Tunnel Vision Technique", to obtain the best possible view of the target area, on the affected side. Parallax error was corrected before commencing the procedure. A "direction-depth-direction" technique was used to introduce the needle under continuous pulsed fluoroscopic guidance. Once the target was reached, antero-posterior, oblique, and lateral fluoroscopic projection views were taken to confirm needle  placement in all planes. Permanently recorded images stored by scanning into EMR. Interpretation: No contrast injected. Intraoperative imaging interpretation by performing Physician. Adequate needle placement confirmed in AP, Lateral, & Oblique Views. Permanent images scanned into the patient's record.  Antibiotic Prophylaxis:  Indication(s): No indications identified. Type:  Antibiotics Given (last 72 hours)    None       Post-operative Assessment:   Complications: No immediate post-treatment complications were observed. Disposition: Return to clinic for follow-up evaluation.  The patient tolerated the entire procedure well. A repeat set of vitals were taken after the procedure and the patient was kept under observation following institutional policy, for this type of procedure. The patient was discharged home, once institutional criteria were met. The patient was provided with post-procedure discharge instructions, including a section on how to identify potential problems. Should any problems arise concerning this procedure, the patient was given instructions to immediately contact us, at any time, without hesitation. In any case, we plan to contact the patient by telephone for a follow-up status report regarding this interventional procedure. Comments:  No additional relevant information.  Medications administered during this visit: We administered lidocaine (PF), ropivacaine (PF) 2 mg/ml (0.2%), methylPREDNISolone acetate, methylPREDNISolone acetate, and ropivacaine (PF) 2 mg/ml (0.2%).  Prescriptions ordered during this visit: New Prescriptions   No medications on file    Future Appointments Date Time Provider [place]  09/06/2015 12:45 PM Milinda Pointer, MD [facility] None  09/27/2015 10:00 AM Milinda Pointer, MD [facility] None    Primary Care Physician: No primary care provider on file. Location: [facility] Note by: Renaud Celli A. Dossie Arbour,  M.D, DABA, DABAPM, DABPM, DABIPP, FIPP  Disclaimer:  Medicine is not an exact science. The only guarantee in medicine is that nothing is guaranteed. It is important to note that the decision to proceed with this intervention was based on the information collected from the patient. The Data and conclusions were drawn from the patient's questionnaire, the interview, and the physical examination. Because the information was provided in large part by the patient, it cannot be guaranteed that it has not been purposely or unconsciously manipulated. Every effort has been made to obtain as much relevant data as possible for this evaluation. It is important to note that the conclusions that lead to this procedure are derived in large part from the available data. Always take into account that the treatment will also be dependent on availability of resources and existing treatment guidelines, considered by other Pain Management Practitioners as being common knowledge and practice, at the time of the intervention. For Medico-Legal purposes, it is also important to point out that variation in procedural techniques and pharmacological choices are the acceptable norm. The indications, contraindications, technique, and results of the above procedure should only be interpreted and judged by a Board-Certified Interventional Pain Specialist with extensive familiarity and expertise in the same exact procedure and technique. Attempts at providing opinions without similar or greater experience and expertise than that of the treating physician will be considered as inappropriate and unethical, and shall result in a formal complaint to the state medical board and applicable specialty societies.

## 2015-08-07 NOTE — Progress Notes (Signed)
Safety precautions to be maintained throughout the outpatient stay will include: orient to surroundings, keep bed in low position, maintain call bell within reach at all times, provide assistance with transfer out of bed and ambulation.  

## 2015-08-08 ENCOUNTER — Telehealth: Payer: Self-pay | Admitting: *Deleted

## 2015-08-08 NOTE — Telephone Encounter (Signed)
Attempted to call patient for post procedure follow up. No answer.

## 2015-09-06 ENCOUNTER — Encounter: Payer: Self-pay | Admitting: Pain Medicine

## 2015-09-06 ENCOUNTER — Ambulatory Visit: Payer: Medicare Other | Attending: Pain Medicine | Admitting: Pain Medicine

## 2015-09-06 VITALS — BP 95/63 | HR 89 | Temp 98.3°F | Resp 16 | Ht 72.0 in | Wt 193.0 lb

## 2015-09-06 DIAGNOSIS — G8929 Other chronic pain: Secondary | ICD-10-CM | POA: Diagnosis not present

## 2015-09-06 DIAGNOSIS — I739 Peripheral vascular disease, unspecified: Secondary | ICD-10-CM | POA: Insufficient documentation

## 2015-09-06 DIAGNOSIS — M25551 Pain in right hip: Secondary | ICD-10-CM | POA: Diagnosis not present

## 2015-09-06 DIAGNOSIS — M25561 Pain in right knee: Secondary | ICD-10-CM | POA: Diagnosis not present

## 2015-09-06 DIAGNOSIS — M1611 Unilateral primary osteoarthritis, right hip: Secondary | ICD-10-CM | POA: Insufficient documentation

## 2015-09-06 DIAGNOSIS — E785 Hyperlipidemia, unspecified: Secondary | ICD-10-CM | POA: Insufficient documentation

## 2015-09-06 DIAGNOSIS — I4891 Unspecified atrial fibrillation: Secondary | ICD-10-CM | POA: Diagnosis not present

## 2015-09-06 DIAGNOSIS — M1711 Unilateral primary osteoarthritis, right knee: Secondary | ICD-10-CM | POA: Diagnosis not present

## 2015-09-06 DIAGNOSIS — I2510 Atherosclerotic heart disease of native coronary artery without angina pectoris: Secondary | ICD-10-CM | POA: Insufficient documentation

## 2015-09-06 DIAGNOSIS — Z955 Presence of coronary angioplasty implant and graft: Secondary | ICD-10-CM | POA: Diagnosis not present

## 2015-09-06 DIAGNOSIS — I129 Hypertensive chronic kidney disease with stage 1 through stage 4 chronic kidney disease, or unspecified chronic kidney disease: Secondary | ICD-10-CM | POA: Insufficient documentation

## 2015-09-06 DIAGNOSIS — M87051 Idiopathic aseptic necrosis of right femur: Secondary | ICD-10-CM

## 2015-09-06 DIAGNOSIS — M879 Osteonecrosis, unspecified: Secondary | ICD-10-CM | POA: Diagnosis not present

## 2015-09-06 DIAGNOSIS — M25572 Pain in left ankle and joints of left foot: Secondary | ICD-10-CM | POA: Diagnosis not present

## 2015-09-06 DIAGNOSIS — F1190 Opioid use, unspecified, uncomplicated: Secondary | ICD-10-CM

## 2015-09-06 DIAGNOSIS — Z7901 Long term (current) use of anticoagulants: Secondary | ICD-10-CM | POA: Insufficient documentation

## 2015-09-06 DIAGNOSIS — I252 Old myocardial infarction: Secondary | ICD-10-CM | POA: Insufficient documentation

## 2015-09-06 DIAGNOSIS — Z87891 Personal history of nicotine dependence: Secondary | ICD-10-CM | POA: Insufficient documentation

## 2015-09-06 DIAGNOSIS — E039 Hypothyroidism, unspecified: Secondary | ICD-10-CM | POA: Insufficient documentation

## 2015-09-06 DIAGNOSIS — N183 Chronic kidney disease, stage 3 (moderate): Secondary | ICD-10-CM | POA: Insufficient documentation

## 2015-09-06 DIAGNOSIS — M25562 Pain in left knee: Secondary | ICD-10-CM

## 2015-09-06 DIAGNOSIS — Z7189 Other specified counseling: Secondary | ICD-10-CM

## 2015-09-06 DIAGNOSIS — Z79891 Long term (current) use of opiate analgesic: Secondary | ICD-10-CM | POA: Diagnosis not present

## 2015-09-06 DIAGNOSIS — T402X5A Adverse effect of other opioids, initial encounter: Secondary | ICD-10-CM | POA: Insufficient documentation

## 2015-09-06 DIAGNOSIS — K5903 Drug induced constipation: Secondary | ICD-10-CM | POA: Insufficient documentation

## 2015-09-06 DIAGNOSIS — M25579 Pain in unspecified ankle and joints of unspecified foot: Secondary | ICD-10-CM

## 2015-09-06 MED ORDER — OXYCODONE HCL 5 MG PO TABS: 5.0000 mg | ORAL_TABLET | Freq: Two times a day (BID) | ORAL | 0 refills | Status: DC | PRN

## 2015-09-06 MED ORDER — LUBIPROSTONE 8 MCG PO CAPS: 8.0000 ug | ORAL_CAPSULE | Freq: Two times a day (BID) | ORAL | 99 refills | Status: DC

## 2015-09-06 MED ORDER — BENEFIBER PO POWD: ORAL | 99 refills | Status: DC

## 2015-09-06 NOTE — Progress Notes (Signed)
Patient's Name: Louis Wells  Patient type: Established  MRN: [id number]  Service setting: Ambulatory outpatient  DOB: [birth year]  Location: [facility]  DOS: 09/06/2015  Primary Care Physician: No primary care provider on file.  Note by: Charlisha Market A. Laban Emperor, M.D, DABA, DABAPM, DABPM, DABIPP, FIPP  Referring Physician: No ref. provider found  Specialty: Board-Certified Interventional Pain Management  Last Visit to Pain Management: 08/08/2015   Primary Reason(s) for Visit: Encounter for prescription drug management & post-procedure evaluation of chronic illness with mild to moderate exacerbation(Level of risk: moderate) CC: Hip Pain (right); Knee Pain (right); and Ankle Pain (left)   HPI  Louis Wells is a 82 y.o. year old, male patient, who returns today as an established patient. He has A-fib (HCC); Acute myocardial infarction of anterior wall (HCC); Arteriosclerosis of coronary artery; Chronic kidney disease, stage III (moderate); Long term current use of opiate analgesic; History of cardiac catheterization; BP (high blood pressure); Hyperlipidemia; Hypothyroidism; Chronic pain; Long term prescription opiate use; Opiate use (10 MME/Day); Encounter for therapeutic drug level monitoring; Disturbance of skin sensation; Pain in right hip; Pain in ankle; Chronic knee pain (Location of Secondary source of pain) (Bilateral) (R>L); Osteoarthritis of hip (Location of Primary Source of Pain) (Right); Osteoarthritis of ankle (Location of Tertiary source of pain) (Bilateral) (L>R); Osteoarthritis of knee (Location of Secondary source of pain) (Right); Avascular necrosis of femur head (HCC) (Right); Fluid in right knee joint; Encounter for chronic pain management; 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 His primarily concern today is the Hip Pain (right); Knee Pain (right); and Ankle Pain  (left)   Pain Assessment: Self-Reported Pain Score: 3              Reported level is compatible with observation       Pain Type: Chronic pain Pain Location: Hip (knee and ankle.) Pain Orientation: Right (right and left ankle) Pain Descriptors / Indicators: Sharp (sharp when bearing weight) Pain Frequency: Constant  The patient comes into the clinics today for post-procedure evaluation on the interventional treatment done on 08/07/2015. In addition, he comes in today for pharmacological management of his chronic pain.  The patient  reports that he does not use drugs. Had his orthopedic consult and has decided against surgery. He wants to continue with the PRN injections.  Date of Last Visit: 08/07/15 Service Provided on Last Visit: Procedure  Controlled Substance Pharmacotherapy Assessment & REMS (Risk Evaluation and Mitigation Strategy)  Analgesic: Hydrocodone/APAP 5/325 one twice a day when necessary. MME/day: 10 mg/day.  Pill Count: Patient did not bring his pills to the appointment today. Pharmacokinetics: Onset of action (Liberation/Absorption): Within expected pharmacological parameters Time to Peak effect (Distribution): Timing and results are as within normal expected parameters Duration of action (Metabolism/Excretion): Within normal limits for medication Pharmacodynamics: Analgesic Effect: More than 50% Activity Facilitation: Medication(s) allow patient to sit, stand, walk, and do the basic ADLs Perceived Effectiveness: Described as relatively effective, allowing for increase in activities of daily living (ADL) Side-effects or Adverse reactions: None reported Monitoring: [place] PMP: Online review of the past 19-month period conducted. Compliant with practice rules and regulations Last UDS on record: ToxAssure Select 13  Date Value Ref Range Status  07/03/2015 FINAL  Final    Comment:    ==================================================================== TOXASSURE SELECT  13 (MW) ==================================================================== Test  Result       Flag       Units Drug Present and Declared for Prescription Verification   Hydrocodone                    432          EXPECTED   ng/mg creat   Dihydrocodeine                 65           EXPECTED   ng/mg creat   Norhydrocodone                 537          EXPECTED   ng/mg creat    Sources of hydrocodone include scheduled prescription    medications. Dihydrocodeine and norhydrocodone are expected    metabolites of hydrocodone. Dihydrocodeine is also available as a    scheduled prescription medication. ==================================================================== Test                      Result    Flag   Units      Ref Range   Creatinine              171              mg/dL      >=69 ==================================================================== Declared Medications:  The flagging and interpretation on this report are based on the  following declared medications.  Unexpected results may arise from  inaccuracies in the declared medications.  **Note: The testing scope of this panel includes these medications:  Hydrocodone (Hydrocodone-Acetaminophen)  **Note: The testing scope of this panel does not include following  reported medications:  Acetaminophen (Hydrocodone-Acetaminophen)  Apixaban (Eliquis)  Cyanocobalamin  Diclofenac (Voltaren)  Famotidine (Pepcid)  Levothyroxine  Lisinopril  Meloxicam (Mobic)  Metoprolol  Nitroglycerin ==================================================================== For clinical consultation, please call [phone]. ====================================================================    UDS interpretation: Compliant          Medication Assessment Form: Reviewed. Patient indicates being compliant with therapy Treatment compliance: Compliant Risk Assessment: Aberrant Behavior: None observed today Substance  Use Disorder (SUD) Risk Level: Low Risk of opioid abuse or dependence: 0.7-3.0% with doses ? 36 MME/day and 6.1-26% with doses ? 120 MME/day. Opioid Risk Tool (ORT) Score: Total Score: 0 Low Risk for SUD (Score <3) Depression Scale Score: PHQ-2: PHQ-2 Total Score: 0 No depression (0) PHQ-9: PHQ-9 Total Score: 0 No depression (0-4)  Pharmacologic Plan: No change in therapy, at this time  Post-Procedure Assessment  Procedure done on last visit: Diagnostic right intra-articular hip joint injection and knee joint injection. Side-effects or Adverse reactions: None reported Sedation: No sedation used  Results: Ultra-Short Term Relief (First 1 hour after procedure): 70 %  Analgesia during this period is likely to be Local Anesthetic and/or IV Sedative (Analgesic/Anxiolitic) related Short Term Relief (Initial 4-6 hrs after procedure): 70 % Complete relief would confirms area to be the source of pain Long Term Relief : 70 % (70% when sitting, a little less effective when standing but still percieves good results) Long-term benefit would suggest an inflammatory etiology to the pain   Current Relief (Now): 70%   Persistent relief would suggest effective anti-inflammatory effects from steroids Interpretation of Results: The results of this diagnostic injection would suggest the knee and the hip injection to be good palliative alternatives.  Laboratory Chemistry  Inflammation Markers Lab Results  Component Value Date   ESRSEDRATE  26 (H) 07/03/2015   CRP 0.7 07/03/2015    Renal Function Lab Results  Component Value Date   BUN 32 (H) 07/03/2015   CREATININE 1.45 (H) 07/03/2015   GFRAA 51 (L) 07/03/2015   GFRNONAA 44 (L) 07/03/2015    Hepatic Function Lab Results  Component Value Date   AST 22 07/03/2015   ALT 11 (L) 07/03/2015   ALBUMIN 4.2 07/03/2015    Electrolytes Lab Results  Component Value Date   NA 137 07/03/2015   K 5.3 (H) 07/03/2015   CL 106 07/03/2015   CALCIUM 9.0  07/03/2015   MG 2.1 07/03/2015    Pain Modulating Vitamins Lab Results  Component Value Date   25OHVITD1 35 07/03/2015   25OHVITD2 <1.0 07/03/2015   25OHVITD3 34 07/03/2015   VITAMINB12 806 07/03/2015    Coagulation Parameters No results found for: INR, LABPROT, APTT, PLT  Cardiovascular No results found for: BNP, HGB, HCT  Note: Lab results reviewed.  Recent Diagnostic Imaging  Dg Ankle Complete Left Result Date: 07/04/2015 CLINICAL DATA:  Bilateral ankle pain for the past 5 DIS 6 months; history of arthritis ; no known injury EXAM: LEFT ANKLE COMPLETE - 3+ VIEW COMPARISON:  None in PACs FINDINGS: The bones are reasonably well mineralized for age. There is severe narrowing of the joint mortise in its mid and lateral portions. There is no acute or healing fracture. The articulation of the distal fibula with the lateral margin of the talus exhibits some sclerosis with joint space loss. No acute or healing malleolar fracture is observed. The talar dome exhibits no evidence of collapse. The calcaneus exhibits no acute abnormality. There is a plantar calcaneal spur. There are vascular calcifications. IMPRESSION: Moderate to severe osteoarthritic change of the tibiotalar and fibulotalar articulations. There is no acute fracture nor dislocation. Electronically Signed   By: David  [place] M.D.   On: 07/04/2015 08:16   Dg Ankle Complete Right Result Date: 07/04/2015 CLINICAL DATA:  Chronic ankle pain. EXAM: RIGHT ANKLE - COMPLETE 3+ VIEW COMPARISON:  No prior. FINDINGS: Soft tissue swelling noted over the medial malleolus. No evidence of fracture dislocation. Diffuse degenerative change. Peripheral vascular calcification. IMPRESSION: 1. Soft tissue swelling over the medial malleolus. No acute bony abnormality. Degenerative changes noted about the left ankle. 2. Peripheral vascular disease. Electronically Signed   By: Maisie Fus  Register   On: 07/04/2015 08:17    Meds  The patient has a current  medication list which includes the following prescription(s): acetaminophen, apixaban, cyanocobalamin, famotidine, levothyroxine, lisinopril, metoprolol, nitroglycerin, oxycodone, lubiprostone, omeprazole, oxycodone, and benefiber.  Current Outpatient Prescriptions on File Prior to Visit  Medication Sig  . acetaminophen (TYLENOL) 325 MG tablet Take 650 mg by mouth 2 (two) times daily.  [name] apixaban (ELIQUIS) 5 MG TABS tablet Take 5 mg by mouth 2 (two) times daily. Reported on 08/07/2015  . Cyanocobalamin (VITAMIN B 12 PO) Take 500 mg by mouth daily.  . famotidine (PEPCID) 20 MG tablet Take 20 mg by mouth 2 (two) times daily.  [name] lisinopril (PRINIVIL,ZESTRIL) 20 MG tablet Take 20 mg by mouth daily.  . metoprolol (LOPRESSOR) 100 MG tablet Take 100 mg by mouth daily.  . nitroGLYCERIN (NITROSTAT) 0.4 MG SL tablet Place under the tongue.   No current facility-administered medications on file prior to visit.     ROS  Constitutional: Denies any fever or chills Gastrointestinal: No reported hemesis, hematochezia, vomiting, or acute GI distress Musculoskeletal: Denies any acute onset joint swelling, redness, loss of ROM, or  weakness Neurological: No reported episodes of acute onset apraxia, aphasia, dysarthria, agnosia, amnesia, paralysis, loss of coordination, or loss of consciousness  Allergies  Louis Wells is allergic to contrast media [iodinated diagnostic agents] and amoxicillin.  PFSH  Medical:  Louis Wells  has a past medical history of Arthralgia of ankle or foot (11/20/2014); Atrial fibrillation (HCC); Degenerative arthritis of hip (04/05/2015); Hyperlipidemia; Hypertension; Myocardial infarction (HCC); Primary osteoarthritis of right knee (05/22/2015); and Thyroid disease. Family: family history includes Cancer in his mother; Diabetes in his father. Surgical:  has a past surgical history that includes Coronary angioplasty with stent. Tobacco:  reports that he has quit smoking. He has never  used smokeless tobacco. Alcohol:  reports that he does not drink alcohol. Drug:  reports that he does not use drugs.  Constitutional Exam  Vitals: Blood pressure 95/63, pulse 89, temperature 98.3 F (36.8 C), temperature source Oral, resp. rate 16, height 6' (1.829 m), weight 193 lb (87.5 kg), SpO2 96 %. General appearance: Well nourished, well developed, and well hydrated. In no acute distress Calculated BMI/Body habitus: Body mass index is 26.18 kg/m. (25-29.9 kg/m2) Overweight - 20% higher incidence of chronic pain Psych/Mental status: Alert and oriented x 3 (person, place, & time) Eyes: PERLA Respiratory: No evidence of acute respiratory distress  Cervical Spine Exam  Inspection: No masses, redness, or swelling Alignment: Symmetrical ROM: Functional: ROM is within functional limits [facility]) Stability: No instability detected Muscle strength & Tone: Functionally intact Sensory: Unimpaired Palpation: No complaints of tenderness  Upper Extremity (UE) Exam    Side: Right upper extremity  Side: Left upper extremity  Inspection: No masses, redness, swelling, or asymmetry  Inspection: No masses, redness, swelling, or asymmetry  ROM:  ROM:  Functional: ROM is within functional limits [facility])        Functional: ROM is within functional limits [facility])        Muscle strength & Tone: Functionally intact  Muscle strength & Tone: Functionally intact  Sensory: Unimpaired  Sensory: Unimpaired  Palpation: No complaints of tenderness  Palpation: No complaints of tenderness   Thoracic Spine Exam  Inspection: No masses, redness, or swelling Alignment: Symmetrical ROM: Functional: ROM is within functional limits [facility]) Stability: No instability detected Sensory: Unimpaired Muscle strength & Tone: Functionally intact Palpation: No complaints of tenderness  Lumbar Spine Exam  Inspection: No masses, redness, or swelling Alignment: Symmetrical ROM: Functional: ROM is within functional limits  [facility]) Stability: No instability detected Muscle strength & Tone: Functionally intact Sensory: Unimpaired Palpation: No complaints of tenderness Provocative Tests: Lumbar Hyperextension and rotation test: provocative test deferred today       Patrick's Maneuver: provocative test deferred today              Gait & Posture Assessment  Ambulation: Unassisted Gait: Unaffected Posture: WNL   Lower Extremity Exam    Side: Right lower extremity  Side: Left lower extremity  Inspection: No masses, redness, swelling, or asymmetry ROM:  Inspection: No masses, redness, swelling, or asymmetry ROM:  Functional: ROM is within functional limits [facility])        Functional: ROM is within functional limits [facility])        Muscle strength & Tone: Functionally intact  Muscle strength & Tone: Functionally intact  Sensory: Unimpaired  Sensory: Unimpaired  Palpation: No complaints of tenderness  Palpation: No complaints of tenderness   Assessment & Plan  Primary Diagnosis & Pertinent Problem List: The primary encounter diagnosis was Chronic pain. Diagnoses of Chronic knee pain (  Location of Secondary source of pain) (Bilateral) (R>L), Chronic hip pain (Location of Primary Source of Pain) (Right), Avascular necrosis of femur head (HCC) (Right), Long term current use of opiate analgesic, Opiate use (10 MME/Day), Encounter for chronic pain management, Pain in ankle, unspecified laterality, and Opioid-induced constipation (OIC) were also pertinent to this visit.  Visit Diagnosis: 1. Chronic pain   2. Chronic knee pain (Location of Secondary source of pain) (Bilateral) (R>L)   3. Chronic hip pain (Location of Primary Source of Pain) (Right)   4. Avascular necrosis of femur head (HCC) (Right)   5. Long term current use of opiate analgesic   6. Opiate use (10 MME/Day)   7. Encounter for chronic pain management   8. Pain in ankle, unspecified laterality   9. Opioid-induced constipation (OIC)     Problems updated and  reviewed during this visit: Problem  Pain in Right Hip  Pain in Ankle  Encounter for Chronic Pain Management  Opioid-induced constipation (OIC)  Hypothyroidism  Idiopathic Aseptic Necrosis of Right Femur (Hcc)  Pain in Right Knee  Chronic Kidney Disease, Stage III (Moderate)  Hyperlipidemia  A-Fib (Hcc)    Problem-specific Plan(s): No problem-specific Assessment & Plan notes found for this encounter.  No new Assessment & Plan notes have been filed under this hospital service since the last note was generated. Service: Pain Management   Plan of Care   Problem List Items Addressed This Visit      High   Avascular necrosis of femur head (HCC) (Right) (Chronic)   Chronic knee pain (Location of Secondary source of pain) (Bilateral) (R>L) (Chronic)   Relevant Medications   oxyCODONE (OXY IR/ROXICODONE) 5 MG immediate release tablet   oxyCODONE (OXY IR/ROXICODONE) 5 MG immediate release tablet   Chronic pain - Primary (Chronic)   Relevant Medications   oxyCODONE (OXY IR/ROXICODONE) 5 MG immediate release tablet   oxyCODONE (OXY IR/ROXICODONE) 5 MG immediate release tablet   Pain in ankle (Chronic)   Pain in right hip (Chronic)     Medium   Encounter for chronic pain management   Long term current use of opiate analgesic (Chronic)   Opiate use (10 MME/Day) (Chronic)   Opioid-induced constipation (OIC) (Chronic)   Relevant Medications   Wheat Dextrin (BENEFIBER) POWD   lubiprostone (AMITIZA) 8 MCG capsule    Other Visit Diagnoses   None.      Pharmacotherapy (Medications Ordered): Meds ordered this encounter  Medications  . oxyCODONE (OXY IR/ROXICODONE) 5 MG immediate release tablet    Sig: Take 1 tablet (5 mg total) by mouth 2 (two) times daily as needed for severe pain.    Dispense:  60 tablet    Refill:  0    Do not add this medication to the electronic "Automatic Refill" notification system. Patient may have prescription filled one day early if pharmacy is closed  on scheduled refill date. Do not fill until: 09/28/15 To last until: 10/28/15  . DISCONTD: oxyCODONE (OXY IR/ROXICODONE) 5 MG immediate release tablet    Sig: Take 1 tablet (5 mg total) by mouth 2 (two) times daily as needed for severe pain.    Dispense:  60 tablet    Refill:  0    Do not add this medication to the electronic "Automatic Refill" notification system. Patient may have prescription filled one day early if pharmacy is closed on scheduled refill date. Do not fill until: 10/28/15 To last until: 11/27/15  . oxyCODONE (OXY IR/ROXICODONE) 5 MG immediate release  tablet    Sig: Take 1 tablet (5 mg total) by mouth 2 (two) times daily as needed for severe pain.    Dispense:  60 tablet    Refill:  0    Do not add this medication to the electronic "Automatic Refill" notification system. Patient may have prescription filled one day early if pharmacy is closed on scheduled refill date. Do not fill until: 10/28/15 To last until: 11/27/15  . Wheat Dextrin (BENEFIBER) POWD    Sig: Stir 2 tsp. TID into 4-8 oz of any non-carbonated beverage or soft food (hot or cold)    Dispense:  500 g    Refill:  PRN    This is an OTC product. This prescription is to serve as a reminder to the patient as to our preference.  . lubiprostone (AMITIZA) 8 MCG capsule    Sig: Take 1 capsule (8 mcg total) by mouth 2 (two) times daily with a meal. Swallow the medication whole. Do not break or chew the medication.    Dispense:  60 capsule    Refill:  PRN    Do not place this medication, or any other prescription from our practice, on "Automatic Refill". Patient may have prescription filled one day early if pharmacy is closed on scheduled refill date.    Lab-work & Procedure Ordered: No orders of the defined types were placed in this encounter.   Imaging Ordered: None  Interventional Therapies: Scheduled:  None at this time.   Considering:  1. Palliative right intra-articular hip joint injection under  fluoroscopic guidance, without IV sedation. 2. Possible right hip joint radiofrequency ablation under fluoroscopic guidance and IV sedation. 3. Palliative intra-articular right knee injection, without fluoroscopy or IV sedation. 4. Diagnostic right-sided genicular nerve block under fluoroscopic guidance and IV sedation. 5. Possible right sided genicular nerve radiofrequency ablation under fluoroscopic guidance and IV sedation.    PRN Procedures:  1. Palliative right intra-articular hip joint injection under fluoroscopic guidance, without IV sedation. 2. Palliative intra-articular right knee injection, without fluoroscopy or IV sedation. 3. Diagnostic right-sided genicular nerve block under fluoroscopic guidance and IV sedation   Referral(s) or Consult(s): None at this time.  New Prescriptions   LUBIPROSTONE (AMITIZA) 8 MCG CAPSULE    Take 1 capsule (8 mcg total) by mouth 2 (two) times daily with a meal. Swallow the medication whole. Do not break or chew the medication.   WHEAT DEXTRIN (BENEFIBER) POWD    Stir 2 tsp. TID into 4-8 oz of any non-carbonated beverage or soft food (hot or cold)    Medications administered during this visit: Louis Wells had no medications administered during this visit.  Requested PM Follow-up: Return in 2 months (on 11/07/2015) for Med-Mgmt.  Future Appointments Date Time Provider Department Center  09/27/2015 10:00 AM Delano Metz, MD [facility] None    Primary Care Physician: No primary care provider on file. Location: [facility] Note by: Viveka Wilmeth A. Laban Emperor, M.D, DABA, DABAPM, DABPM, DABIPP, FIPP  Pain Score Disclaimer: We use the NRS-11 scale. This is a self-reported, subjective measurement of pain severity with only modest accuracy. It is used primarily to identify changes within a particular patient. It must be understood that outpatient pain scales are significantly less accurate that those used for research, where  they can be applied under ideal controlled circumstances with minimal exposure to variables. In reality, the score is likely to be a combination of pain intensity and pain affect, where pain affect describes the degree  of emotional arousal or changes in action readiness caused by the sensory experience of pain. Factors such as social and work situation, setting, emotional state, anxiety levels, expectation, and prior pain experience may influence pain perception and show large inter-individual differences that may also be affected by time variables.  Patient instructions provided at this appointment:: There are no Patient Instructions on file for this visit.

## 2015-09-06 NOTE — Progress Notes (Signed)
Patient here for post procedure f/up.  Reports good results from procedure and no complications.  Would like to talk about possible procedures for Left ankle.  Patient states he had some trouble with constipation and the pharmacist recommended Colace 2 in the morning and 2 in the evening and after the first BM reduced dosage to 1 in the morning and 1 in the evening.  Now his bowels are moving regular and he is experiencing gas.  Would like to know if there is anything different he should do for the constipation that would be more gentle.   Safety precautions to be maintained throughout the outpatient stay will include: orient to surroundings, keep bed in low position, maintain call bell within reach at all times, provide assistance with transfer out of bed and ambulation.

## 2015-09-27 ENCOUNTER — Encounter: Payer: Medicare Other | Admitting: Pain Medicine

## 2015-11-07 ENCOUNTER — Ambulatory Visit
Admission: RE | Admit: 2015-11-07 | Discharge: 2015-11-07 | Disposition: A | Discharge status: Home / Self Care | Payer: Medicare Other | Source: Ambulatory Visit | Attending: Pain Medicine | Admitting: Pain Medicine

## 2015-11-07 ENCOUNTER — Encounter: Payer: Self-pay | Admitting: Pain Medicine

## 2015-11-07 ENCOUNTER — Ambulatory Visit: Payer: Medicare Other | Attending: Pain Medicine | Admitting: Pain Medicine

## 2015-11-07 VITALS — BP 102/61 | HR 74 | Temp 98.5°F | Resp 14 | Ht 72.0 in | Wt 193.0 lb

## 2015-11-07 DIAGNOSIS — M25562 Pain in left knee: Secondary | ICD-10-CM | POA: Diagnosis not present

## 2015-11-07 DIAGNOSIS — N183 Chronic kidney disease, stage 3 (moderate): Secondary | ICD-10-CM | POA: Diagnosis not present

## 2015-11-07 DIAGNOSIS — M25551 Pain in right hip: Secondary | ICD-10-CM | POA: Diagnosis not present

## 2015-11-07 DIAGNOSIS — K5903 Drug induced constipation: Secondary | ICD-10-CM

## 2015-11-07 DIAGNOSIS — M179 Osteoarthritis of knee, unspecified: Secondary | ICD-10-CM | POA: Insufficient documentation

## 2015-11-07 DIAGNOSIS — G8929 Other chronic pain: Secondary | ICD-10-CM

## 2015-11-07 DIAGNOSIS — T402X5A Adverse effect of other opioids, initial encounter: Secondary | ICD-10-CM

## 2015-11-07 DIAGNOSIS — M19079 Primary osteoarthritis, unspecified ankle and foot: Secondary | ICD-10-CM | POA: Insufficient documentation

## 2015-11-07 DIAGNOSIS — M170 Bilateral primary osteoarthritis of knee: Secondary | ICD-10-CM | POA: Diagnosis not present

## 2015-11-07 DIAGNOSIS — F1190 Opioid use, unspecified, uncomplicated: Secondary | ICD-10-CM

## 2015-11-07 DIAGNOSIS — M25561 Pain in right knee: Secondary | ICD-10-CM | POA: Diagnosis not present

## 2015-11-07 DIAGNOSIS — Z87891 Personal history of nicotine dependence: Secondary | ICD-10-CM | POA: Diagnosis not present

## 2015-11-07 DIAGNOSIS — Z880 Allergy status to penicillin: Secondary | ICD-10-CM | POA: Diagnosis not present

## 2015-11-07 DIAGNOSIS — Z79891 Long term (current) use of opiate analgesic: Secondary | ICD-10-CM

## 2015-11-07 DIAGNOSIS — M25461 Effusion, right knee: Secondary | ICD-10-CM | POA: Insufficient documentation

## 2015-11-07 DIAGNOSIS — Z79899 Other long term (current) drug therapy: Secondary | ICD-10-CM | POA: Diagnosis not present

## 2015-11-07 DIAGNOSIS — M19071 Primary osteoarthritis, right ankle and foot: Secondary | ICD-10-CM

## 2015-11-07 DIAGNOSIS — M7120 Synovial cyst of popliteal space [Baker], unspecified knee: Secondary | ICD-10-CM | POA: Diagnosis not present

## 2015-11-07 DIAGNOSIS — Z955 Presence of coronary angioplasty implant and graft: Secondary | ICD-10-CM | POA: Insufficient documentation

## 2015-11-07 DIAGNOSIS — M1611 Unilateral primary osteoarthritis, right hip: Secondary | ICD-10-CM | POA: Diagnosis not present

## 2015-11-07 DIAGNOSIS — M25572 Pain in left ankle and joints of left foot: Secondary | ICD-10-CM | POA: Diagnosis not present

## 2015-11-07 DIAGNOSIS — Z7901 Long term (current) use of anticoagulants: Secondary | ICD-10-CM | POA: Diagnosis not present

## 2015-11-07 DIAGNOSIS — E785 Hyperlipidemia, unspecified: Secondary | ICD-10-CM | POA: Diagnosis not present

## 2015-11-07 DIAGNOSIS — M1711 Unilateral primary osteoarthritis, right knee: Secondary | ICD-10-CM

## 2015-11-07 DIAGNOSIS — M19072 Primary osteoarthritis, left ankle and foot: Secondary | ICD-10-CM

## 2015-11-07 MED ORDER — OXYCODONE HCL 5 MG PO TABS: 5.0000 mg | ORAL_TABLET | Freq: Two times a day (BID) | ORAL | 0 refills | Status: DC | PRN

## 2015-11-07 MED ORDER — OXYCODONE HCL 5 MG PO TABS
5.0000 mg | ORAL_TABLET | Freq: Two times a day (BID) | ORAL | 0 refills | Status: DC | PRN
Start: 2015-11-27 — End: 2015-11-20

## 2015-11-07 MED ORDER — BENEFIBER PO POWD: ORAL | 99 refills | Status: AC

## 2015-11-07 MED ORDER — LUBIPROSTONE 8 MCG PO CAPS: 8.0000 ug | ORAL_CAPSULE | Freq: Two times a day (BID) | ORAL | 0 refills | Status: DC

## 2015-11-07 NOTE — Patient Instructions (Signed)
Knee Injection A knee injection is a procedure to get medicine into your knee joint. Your health care provider puts a needle into the joint and injects medicine with an attached syringe. The injected medicine may relieve the pain, swelling, and stiffness of arthritis. The injected medicine may also help to lubricate and cushion your knee joint. You may need more than one injection. [facility] KNOW ABOUT:  Any allergies you have.  All medicines you are taking, including vitamins, herbs, eye drops, creams, and over-the-counter medicines.  Previous problems you or members of your family have had with the use of anesthetics.  Any blood disorders you have.  Previous surgeries you have had.  Any medical conditions you may have. RISKS AND COMPLICATIONS Generally, this is a safe procedure. However, problems may occur, including:  Infection.  Bleeding.  Worsening symptoms.  Damage to the area around your knee.  Allergic reaction to any of the medicines.  Skin reactions from repeated injections. BEFORE THE PROCEDURE  Ask your health care provider about changing or stopping your regular medicines. This is especially important if you are taking diabetes medicines or blood thinners.  Plan to have someone take you home after the procedure. PROCEDURE  You will sit or lie down in a position for your knee to be treated.  The skin over your kneecap will be cleaned with a germ-killing solution (antiseptic).  You will be given a medicine that numbs the area (local anesthetic). You may feel some stinging.  After your knee becomes numb, you will have a second injection. This is the medicine. This needle is carefully placed between your kneecap and your knee. The medicine is injected into the joint space.  At the end of the procedure, the needle will be removed.  A bandage (dressing) may be placed over the injection site. The procedure may vary among health care providers  and hospitals. AFTER THE PROCEDURE  You may have to move your knee through its full range of motion. This helps to get all of the medicine into your joint space.  Your blood pressure, heart rate, breathing rate, and blood oxygen level will be monitored often until the medicines you were given have worn off.  You will be watched to make sure that you do not have a reaction to the injected medicine.   This information is not intended to replace advice given to you by your health care provider. Make sure you discuss any questions you have with your health care provider.   Document Released: 04/20/2006 Document Revised: 02/17/2014 Document Reviewed: 12/07/2013 [employer] Interactive Patient Education 2016 [employer] GENERAL RISKS AND COMPLICATIONS  What are the risk, side effects and possible complications? Generally speaking, most procedures are safe.  However, with any procedure there are risks, side effects, and the possibility of complications.  The risks and complications are dependent upon the sites that are lesioned, or the type of nerve block to be performed.  The closer the procedure is to the spine, the more serious the risks are.  Great care is taken when placing the radio frequency needles, block needles or lesioning probes, but sometimes complications can occur. 1. Infection: Any time there is an injection through the skin, there is a risk of infection.  This is why sterile conditions are used for these blocks.  There are four possible types of infection. 1. Localized skin infection. 2. Central Nervous System Infection-This can be in the form of Meningitis, which can be deadly. 3. Epidural  Infections-This can be in the form of an epidural abscess, which can cause pressure inside of the spine, causing compression of the spinal cord with subsequent paralysis. This would require an emergency surgery to decompress, and there are no guarantees that the patient would recover from the  paralysis. 4. Discitis-This is an infection of the intervertebral discs.  It occurs in about 1% of discography procedures.  It is difficult to treat and it may lead to surgery.        2. Pain: the needles have to go through skin and soft tissues, will cause soreness.       3. Damage to internal structures:  The nerves to be lesioned may be near blood vessels or    other nerves which can be potentially damaged.       4. Bleeding: Bleeding is more common if the patient is taking blood thinners such as  aspirin, Coumadin, Ticiid, Plavix, etc., or if he/she have some genetic predisposition  such as hemophilia. Bleeding into the spinal canal can cause compression of the spinal  cord with subsequent paralysis.  This would require an emergency surgery to  decompress and there are no guarantees that the patient would recover from the  paralysis.       5. Pneumothorax:  Puncturing of a lung is a possibility, every time a needle is introduced in  the area of the chest or upper back.  Pneumothorax refers to free air around the  collapsed lung(s), inside of the thoracic cavity (chest cavity).  Another two possible  complications related to a similar event would include: Hemothorax and Chylothorax.   These are variations of the Pneumothorax, where instead of air around the collapsed  lung(s), you may have blood or chyle, respectively.       6. Spinal headaches: They may occur with any procedures in the area of the spine.       7. Persistent CSF (Cerebro-Spinal Fluid) leakage: This is a rare problem, but may occur  with prolonged intrathecal or epidural catheters either due to the formation of a fistulous  track or a dural tear.       8. Nerve damage: By working so close to the spinal cord, there is always a possibility of  nerve damage, which could be as serious as a permanent spinal cord injury with  paralysis.       9. Death:  Although rare, severe deadly allergic reactions known as "Anaphylactic  reaction" can  occur to any of the medications used.      10. Worsening of the symptoms:  We can always make thing worse.  What are the chances of something like this happening? Chances of any of this occuring are extremely low.  By statistics, you have more of a chance of getting killed in a motor vehicle accident: while driving to the hospital than any of the above occurring .  Nevertheless, you should be aware that they are possibilities.  In general, it is similar to taking a shower.  Everybody knows that you can slip, hit your head and get killed.  Does that mean that you should not shower again?  Nevertheless always keep in mind that statistics do not mean anything if you happen to be on the wrong side of them.  Even if a procedure has a 1 (one) in a 1,000,000 (million) chance of going wrong, it you happen to be that one..Also, keep in mind that by statistics, you have more of a chance  of having something go wrong when taking medications.  Who should not have this procedure? If you are on a blood thinning medication (e.g. Coumadin, Plavix, see list of "Blood Thinners"), or if you have an active infection going on, you should not have the procedure.  If you are taking any blood thinners, please inform your physician.  How should I prepare for this procedure?  Do not eat or drink anything at least six hours prior to the procedure.  Bring a driver with you .  It cannot be a taxi.  Come accompanied by an adult that can drive you back, and that is strong enough to help you if your legs get weak or numb from the local anesthetic.  Take all of your medicines the morning of the procedure with just enough water to swallow them.  If you have diabetes, make sure that you are scheduled to have your procedure done first thing in the morning, whenever possible.  If you have diabetes, take only half of your insulin dose and notify our nurse that you have done so as soon as you arrive at the clinic.  If you are  diabetic, but only take blood sugar pills (oral hypoglycemic), then do not take them on the morning of your procedure.  You may take them after you have had the procedure.  Do not take aspirin or any aspirin-containing medications, at least eleven (11) days prior to the procedure.  They may prolong bleeding.  Wear loose fitting clothing that may be easy to take off and that you would not mind if it got stained with Betadine or blood.  Do not wear any jewelry or perfume  Remove any nail coloring.  It will interfere with some of our monitoring equipment.  NOTE: Remember that this is not meant to be interpreted as a complete list of all possible complications.  Unforeseen problems may occur.  BLOOD THINNERS The following drugs contain aspirin or other products, which can cause increased bleeding during surgery and should not be taken for 2 weeks prior to and 1 week after surgery.  If you should need take something for relief of minor pain, you may take acetaminophen which is found in Tylenol,m Datril, Anacin-3 and Panadol. It is not blood thinner. The products listed below are.  Do not take any of the products listed below in addition to any listed on your instruction sheet.  A.P.C or A.P.C with Codeine Codeine Phosphate Capsules #3 Ibuprofen Ridaura  ABC compound Congesprin Imuran rimadil  Advil Cope Indocin Robaxisal  Alka-Seltzer Effervescent Pain Reliever and Antacid Coricidin or Coricidin-D  Indomethacin Rufen  Alka-Seltzer plus Cold Medicine Cosprin Ketoprofen S-A-C Tablets  Anacin Analgesic Tablets or Capsules Coumadin Korlgesic Salflex  Anacin Extra Strength Analgesic tablets or capsules CP-2 Tablets Lanoril Salicylate  Anaprox Cuprimine Capsules Levenox Salocol  Anexsia-D Dalteparin Magan Salsalate  Anodynos Darvon compound Magnesium Salicylate Sine-off  Ansaid Dasin Capsules Magsal Sodium Salicylate  Anturane Depen Capsules Marnal Soma  APF Arthritis pain formula Dewitt's Pills  Measurin Stanback  Argesic Dia-Gesic Meclofenamic Sulfinpyrazone  Arthritis Bayer Timed Release Aspirin Diclofenac Meclomen Sulindac  Arthritis pain formula Anacin Dicumarol Medipren Supac  Analgesic (Safety coated) Arthralgen Diffunasal Mefanamic Suprofen  Arthritis Strength Bufferin Dihydrocodeine Mepro Compound Suprol  Arthropan liquid Dopirydamole Methcarbomol with Aspirin Synalgos  ASA tablets/Enseals Disalcid Micrainin Tagament  Ascriptin Doan's Midol Talwin  Ascriptin A/D Dolene Mobidin Tanderil  Ascriptin Extra Strength Dolobid Moblgesic Ticlid  Ascriptin with Codeine Doloprin or Doloprin with Codeine Momentum  Tolectin  Asperbuf Duoprin Mono-gesic Trendar  Aspergum Duradyne Motrin or Motrin IB Triminicin  Aspirin plain, buffered or enteric coated Durasal Myochrisine Trigesic  Aspirin Suppositories Easprin Nalfon Trillsate  Aspirin with Codeine Ecotrin Regular or Extra Strength Naprosyn Uracel  Atromid-S Efficin Naproxen Ursinus  Auranofin Capsules Elmiron Neocylate Vanquish  Axotal Emagrin Norgesic Verin  Azathioprine Empirin or Empirin with Codeine Normiflo Vitamin E  Azolid Emprazil Nuprin Voltaren  Bayer Aspirin plain, buffered or children's or timed BC Tablets or powders Encaprin Orgaran Warfarin Sodium  Buff-a-Comp Enoxaparin Orudis Zorpin  Buff-a-Comp with Codeine Equegesic Os-Cal-Gesic   Buffaprin Excedrin plain, buffered or Extra Strength Oxalid   Bufferin Arthritis Strength Feldene Oxphenbutazone   Bufferin plain or Extra Strength Feldene Capsules Oxycodone with Aspirin   Bufferin with Codeine Fenoprofen Fenoprofen Pabalate or Pabalate-SF   Buffets II Flogesic Panagesic   Buffinol plain or Extra Strength Florinal or Florinal with Codeine Panwarfarin   Buf-Tabs Flurbiprofen Penicillamine   Butalbital Compound Four-way cold tablets Penicillin   Butazolidin Fragmin Pepto-Bismol   Carbenicillin Geminisyn Percodan   Carna Arthritis Reliever Geopen Persantine     Carprofen Gold's salt Persistin   Chloramphenicol Goody's Phenylbutazone   Chloromycetin Haltrain Piroxlcam   Clmetidine heparin Plaquenil   Cllnoril Hyco-pap Ponstel   Clofibrate Hydroxy chloroquine Propoxyphen         Before stopping any of these medications, be sure to consult the physician who ordered them.  Some, such as Coumadin (Warfarin) are ordered to prevent or treat serious conditions such as "deep thrombosis", "pumonary embolisms", and other heart problems.  The amount of time that you may need off of the medication may also vary with the medication and the reason for which you were taking it.  If you are taking any of these medications, please make sure you notify your pain physician before you undergo any procedures.

## 2015-11-07 NOTE — Progress Notes (Signed)
Safety precautions to be maintained throughout the outpatient stay will include: orient to surroundings, keep bed in low position, maintain call bell within reach at all times, provide assistance with transfer out of bed and ambulation. .  Oxycodone 5 mg #42 out of 60 remaining. Filled 10-28-15

## 2015-11-07 NOTE — Progress Notes (Signed)
Patient's Name: Louis Wells  MRN: [id number]  Referring Provider: No ref. provider found  DOB: 1932-02-23  PCP: No primary care provider on file.  DOS: 11/07/2015  Note by: Sydnee Levans. Laban Emperor, MD  Service setting: Ambulatory outpatient  Specialty: Interventional Pain Management  Location: [facility] (AMB) Pain Management Facility    Patient type: Established   Primary Reason(s) for Visit: Encounter for prescription drug management (Level of risk: moderate) CC: Hip Pain (right) and Knee Pain (bilateral)  HPI  Louis Wells is a 82 y.o. year old, male patient, who comes today for an initial evaluation. He has A-fib (HCC); Acute myocardial infarction of anterior wall (HCC); Arteriosclerosis of coronary artery; Chronic kidney disease, stage III (moderate); Long term current use of opiate analgesic; History of cardiac catheterization; BP (high blood pressure); Hyperlipidemia; Hypothyroidism; Chronic pain; Long term prescription opiate use; Opiate use (10 MME/Day); Encounter for therapeutic drug level monitoring; Disturbance of skin sensation; Pain Hip (Right); Pain in ankle; Chronic knee pain (Location of Secondary source of pain) (Bilateral) (R>L); Osteoarthritis of hip (Location of Primary Source of Pain) (Right); Osteoarthritis of ankle (Location of Tertiary source of pain) (Bilateral) (L>R); Osteoarthritis of knee (Location of Secondary source of pain) (Right); Avascular necrosis of femur head (HCC) (Right); Fluid in knee joint (Right); Encounter for chronic pain management; 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 His primarily concern today is the Hip Pain (right) and Knee Pain (bilateral)  Pain Assessment: Self-Reported Pain Score: 4 /10             Reported level is compatible with observation.       Pain Type: Chronic pain Pain Location: Hip Pain Orientation: Right Pain  Descriptors / Indicators: Aching Pain Frequency: Constant  The patient comes into the clinics today for pharmacological management of his chronic pain. I last saw this patient on 09/06/2015. The patient  reports that he does not use drugs. His body mass index is 26.18 kg/m.  Date of Last Visit: 09/06/15 Service Provided on Last Visit: Med Refill  Controlled Substance Pharmacotherapy Assessment & REMS (Risk Evaluation and Mitigation Strategy)  Analgesic:Hydrocodone/APAP 5/325 one twice a day when necessary. MME/day:10 mg/day.  Pill Count: Oxycodone 5 mg #42 out of 60 remaining. Filled 10-28-15. Pharmacokinetics: Onset of action (Liberation/Absorption): Within expected pharmacological parameters Time to Peak effect (Distribution): Timing and results are as within normal expected parameters Duration of action (Metabolism/Excretion): Within normal limits for medication Pharmacodynamics: Analgesic Effect: More than 50% Activity Facilitation: Medication(s) allow patient to sit, stand, walk, and do the basic ADLs Perceived Effectiveness: Described as relatively effective, allowing for increase in activities of daily living (ADL) Side-effects or Adverse reactions: None reported Monitoring: [place] PMP: Online review of the past 36-month period conducted. Compliant with practice rules and regulations List of all UDS test(s) done:  Lab Results  Component Value Date   TOXASSSELUR FINAL 07/03/2015   Last UDS on record: ToxAssure Select 13  Date Value Ref Range Status  07/03/2015 FINAL  Final    Comment:    ==================================================================== TOXASSURE SELECT 13 (MW) ==================================================================== Test                             Result       Flag       Units Drug Present and Declared for Prescription Verification   Hydrocodone  432          EXPECTED   ng/mg creat   Dihydrocodeine                 65            EXPECTED   ng/mg creat   Norhydrocodone                 537          EXPECTED   ng/mg creat    Sources of hydrocodone include scheduled prescription    medications. Dihydrocodeine and norhydrocodone are expected    metabolites of hydrocodone. Dihydrocodeine is also available as a    scheduled prescription medication. ==================================================================== Test                      Result    Flag   Units      Ref Range   Creatinine              171              mg/dL      >=82 ==================================================================== Declared Medications:  The flagging and interpretation on this report are based on the  following declared medications.  Unexpected results may arise from  inaccuracies in the declared medications.  **Note: The testing scope of this panel includes these medications:  Hydrocodone (Hydrocodone-Acetaminophen)  **Note: The testing scope of this panel does not include following  reported medications:  Acetaminophen (Hydrocodone-Acetaminophen)  Apixaban (Eliquis)  Cyanocobalamin  Diclofenac (Voltaren)  Famotidine (Pepcid)  Levothyroxine  Lisinopril  Meloxicam (Mobic)  Metoprolol  Nitroglycerin ==================================================================== For clinical consultation, please call [phone]. ====================================================================    UDS interpretation: Compliant          Medication Assessment Form: Reviewed. Patient indicates being compliant with therapy Treatment compliance: Compliant Risk Assessment: Aberrant Behavior: None observed today Substance Use Disorder (SUD) Risk Level: Low Risk of opioid abuse or dependence: 0.7-3.0% with doses ? 36 MME/day and 6.1-26% with doses ? 120 MME/day. Opioid Risk Tool (ORT) Score: 0   Low Risk for SUD (Score <3) Depression Scale Score: PHQ-2: 0   No depression (0) PHQ-9: 0   No depression (0-4)  Pharmacologic Plan:  No change in therapy, at this time  Laboratory Chemistry  Inflammation Markers Lab Results  Component Value Date   ESRSEDRATE 26 (H) 07/03/2015   CRP 0.7 07/03/2015   Renal Function Lab Results  Component Value Date   BUN 32 (H) 07/03/2015   CREATININE 1.45 (H) 07/03/2015   GFRAA 51 (L) 07/03/2015   GFRNONAA 44 (L) 07/03/2015   Hepatic Function Lab Results  Component Value Date   AST 22 07/03/2015   ALT 11 (L) 07/03/2015   ALBUMIN 4.2 07/03/2015   Electrolytes Lab Results  Component Value Date   NA 137 07/03/2015   K 5.3 (H) 07/03/2015   CL 106 07/03/2015   CALCIUM 9.0 07/03/2015   MG 2.1 07/03/2015   Pain Modulating Vitamins Lab Results  Component Value Date   25OHVITD1 35 07/03/2015   25OHVITD2 <1.0 07/03/2015   25OHVITD3 34 07/03/2015   VITAMINB12 806 07/03/2015   Coagulation Parameters No results found for: INR, LABPROT, APTT, PLT Cardiovascular No results found for: BNP, HGB, HCT  Note: Lab results reviewed.  Recent Diagnostic Imaging  Dg Ankle Complete Left  Result Date: 07/04/2015 CLINICAL DATA:  Bilateral ankle pain for the past 5 DIS 6 months; history of arthritis ; no known injury  EXAM: LEFT ANKLE COMPLETE - 3+ VIEW COMPARISON:  None in PACs FINDINGS: The bones are reasonably well mineralized for age. There is severe narrowing of the joint mortise in its mid and lateral portions. There is no acute or healing fracture. The articulation of the distal fibula with the lateral margin of the talus exhibits some sclerosis with joint space loss. No acute or healing malleolar fracture is observed. The talar dome exhibits no evidence of collapse. The calcaneus exhibits no acute abnormality. There is a plantar calcaneal spur. There are vascular calcifications. IMPRESSION: Moderate to severe osteoarthritic change of the tibiotalar and fibulotalar articulations. There is no acute fracture nor dislocation. Electronically Signed   By: David  [place] M.D.   On: 07/04/2015  08:16   Dg Ankle Complete Right  Result Date: 07/04/2015 CLINICAL DATA:  Chronic ankle pain. EXAM: RIGHT ANKLE - COMPLETE 3+ VIEW COMPARISON:  No prior. FINDINGS: Soft tissue swelling noted over the medial malleolus. No evidence of fracture dislocation. Diffuse degenerative change. Peripheral vascular calcification. IMPRESSION: 1. Soft tissue swelling over the medial malleolus. No acute bony abnormality. Degenerative changes noted about the left ankle. 2. Peripheral vascular disease. Electronically Signed   By: Maisie Fus  Register   On: 07/04/2015 08:17   Meds  The patient has a current medication list which includes the following prescription(s): acetaminophen, apixaban, cyanocobalamin, famotidine, levothyroxine, lisinopril, lubiprostone, metoprolol, nitroglycerin, omeprazole, oxycodone, oxycodone, oxycodone, and benefiber.  Current Outpatient Prescriptions on File Prior to Visit  Medication Sig  . acetaminophen (TYLENOL) 325 MG tablet Take 650 mg by mouth 2 (two) times daily.  . Cyanocobalamin (VITAMIN B 12 PO) Take 500 mg by mouth daily.  . famotidine (PEPCID) 20 MG tablet Take 20 mg by mouth 2 (two) times daily.  [name] levothyroxine (SYNTHROID, LEVOTHROID) 112 MCG tablet 112 mcg daily before breakfast.   . lisinopril (PRINIVIL,ZESTRIL) 20 MG tablet Take 20 mg by mouth daily.  . metoprolol (LOPRESSOR) 100 MG tablet Take 100 mg by mouth daily.  . nitroGLYCERIN (NITROSTAT) 0.4 MG SL tablet Place under the tongue.  [name] omeprazole (PRILOSEC) 20 MG capsule    No current facility-administered medications on file prior to visit.    ROS  Constitutional: Denies any fever or chills Gastrointestinal: No reported hemesis, hematochezia, vomiting, or acute GI distress Musculoskeletal: Denies any acute onset joint swelling, redness, loss of ROM, or weakness Neurological: No reported episodes of acute onset apraxia, aphasia, dysarthria, agnosia, amnesia, paralysis, loss of coordination, or loss of  consciousness  Allergies  Louis Wells is allergic to contrast media [iodinated diagnostic agents] and amoxicillin.  PFSH  Medical:  Louis Wells  has a past medical history of Arthralgia of ankle or foot (11/20/2014); Atrial fibrillation (HCC); Degenerative arthritis of hip (04/05/2015); Hyperlipidemia; Hypertension; Myocardial infarction (HCC); Primary osteoarthritis of right knee (05/22/2015); and Thyroid disease. Family: family history includes Cancer in his mother; Diabetes in his father. Surgical:  has a past surgical history that includes Coronary angioplasty with stent. Tobacco:  reports that he has quit smoking. He has never used smokeless tobacco. Alcohol:  reports that he does not drink alcohol. Drug:  reports that he does not use drugs.  Constitutional Exam  General appearance: Well nourished, well developed, and well hydrated. In no acute distress Vitals:   11/07/15 1345  BP: 102/61  Pulse: 74  Resp: 14  Temp: 98.5 F (36.9 C)  TempSrc: Oral  SpO2: 99%  Weight: 193 lb (87.5 kg)  Height: 6' (1.829 m)  BMI Assessment: Estimated body mass  index is 26.18 kg/m as calculated from the following:   Height as of this encounter: 6' (1.829 m).   Weight as of this encounter: 193 lb (87.5 kg).   BMI interpretation: (25-29.9 kg/m2) = Overweight: This range is associated with a 20% higher incidence of chronic pain. BMI Readings from Last 4 Encounters:  11/07/15 26.18 kg/m  09/06/15 26.18 kg/m  08/07/15 26.18 kg/m  07/30/15 26.18 kg/m   Wt Readings from Last 4 Encounters:  11/07/15 193 lb (87.5 kg)  09/06/15 193 lb (87.5 kg)  08/07/15 193 lb (87.5 kg)  07/30/15 193 lb (87.5 kg)  Psych/Mental status: Alert and oriented x 3 (person, place, & time) Eyes: PERLA Respiratory: No evidence of acute respiratory distress  Cervical Spine Exam  Inspection: No masses, redness, or swelling Alignment: Symmetrical Functional ROM: Unrestricted ROM Stability: No instability  detected Muscle strength & Tone: Functionally intact Sensory: Unimpaired Palpation: Non-contributory  Upper Extremity (UE) Exam    Side: Right upper extremity  Side: Left upper extremity  Inspection: No masses, redness, swelling, or asymmetry  Inspection: No masses, redness, swelling, or asymmetry  Functional ROM: Unrestricted ROM         Functional ROM: Unrestricted ROM          Muscle strength & Tone: Functionally intact  Muscle strength & Tone: Functionally intact  Sensory: Unimpaired  Sensory: Unimpaired  Palpation: Non-contributory  Palpation: Non-contributory   Thoracic Spine Exam  Inspection: No masses, redness, or swelling Alignment: Symmetrical Functional ROM: Unrestricted ROM Stability: No instability detected Sensory: Unimpaired Muscle strength & Tone: Functionally intact Palpation: Non-contributory  Lumbar Spine Exam  Inspection: No masses, redness, or swelling Alignment: Symmetrical Functional ROM: Unrestricted ROM Stability: No instability detected Muscle strength & Tone: Functionally intact Sensory: Unimpaired Palpation: Non-contributory Provocative Tests: Lumbar Hyperextension and rotation test: evaluation deferred today       Patrick's Maneuver: evaluation deferred today              Gait & Posture Assessment  Ambulation: Patient ambulates using a wheel chair Gait: Very limited, using assistive device to ambulate Posture: WNL   Lower Extremity Exam    Side: Right lower extremity  Side: Left lower extremity  Inspection: No masses, redness, swelling, or asymmetry  Inspection: No masses, redness, swelling, or asymmetry  Functional ROM: Unrestricted ROM          Functional ROM: Unrestricted ROM          Muscle strength & Tone: Functionally intact  Muscle strength & Tone: Functionally intact  Sensory: Unimpaired  Sensory: Unimpaired  Palpation: Non-contributory  Palpation: Non-contributory   Assessment  Primary Diagnosis & Pertinent Problem List: The  primary encounter diagnosis was Chronic pain. Diagnoses of Long term current use of opiate analgesic, Opiate use (10 MME/Day), Primary osteoarthritis of right hip, Chronic knee pain (Location of Secondary source of pain) (Bilateral) (R>L), Osteoarthritis of right knee, unspecified osteoarthritis type, Osteoarthritis of ankle (Location of Tertiary source of pain) (Bilateral) (L>R), Opioid-induced constipation (OIC), Baker's cyst of knee, unspecified laterality, and Pain Hip (Right) were also pertinent to this visit.  Visit Diagnosis: 1. Chronic pain   2. Long term current use of opiate analgesic   3. Opiate use (10 MME/Day)   4. Primary osteoarthritis of right hip   5. Chronic knee pain (Location of Secondary source of pain) (Bilateral) (R>L)   6. Osteoarthritis of right knee, unspecified osteoarthritis type   7. Osteoarthritis of ankle (Location of Tertiary source of pain) (Bilateral) (L>R)  8. Opioid-induced constipation (OIC)   9. Baker's cyst of knee, unspecified laterality   10. Pain Hip (Right)    Plan of Care  Pharmacotherapy (Medications Ordered): Meds ordered this encounter  Medications  . oxyCODONE (OXY IR/ROXICODONE) 5 MG immediate release tablet    Sig: Take 1 tablet (5 mg total) by mouth 2 (two) times daily as needed for severe pain.    Dispense:  60 tablet    Refill:  0    Do not add this medication to the electronic "Automatic Refill" notification system. Patient may have prescription filled one day early if pharmacy is closed on scheduled refill date. Do not fill until: 11/27/15 To last until: 12/27/15  . oxyCODONE (OXY IR/ROXICODONE) 5 MG immediate release tablet    Sig: Take 1 tablet (5 mg total) by mouth 2 (two) times daily as needed for severe pain.    Dispense:  60 tablet    Refill:  0    Do not add this medication to the electronic "Automatic Refill" notification system. Patient may have prescription filled one day early if pharmacy is closed on scheduled refill  date. Do not fill until: 12/27/15 To last until: 01/26/16  . oxyCODONE (OXY IR/ROXICODONE) 5 MG immediate release tablet    Sig: Take 1 tablet (5 mg total) by mouth 2 (two) times daily as needed for severe pain.    Dispense:  60 tablet    Refill:  0    Do not add this medication to the electronic "Automatic Refill" notification system. Patient may have prescription filled one day early if pharmacy is closed on scheduled refill date. Do not fill until: 01/26/16 To last until: 02/25/16  . lubiprostone (AMITIZA) 8 MCG capsule    Sig: Take 1 capsule (8 mcg total) by mouth 2 (two) times daily with a meal. Swallow the medication whole. Do not break or chew the medication.    Dispense:  180 capsule    Refill:  0    Do not place this medication, or any other prescription from our practice, on "Automatic Refill". Patient may have prescription filled one day early if pharmacy is closed on scheduled refill date.  . Wheat Dextrin (BENEFIBER) POWD    Sig: Stir 2 tsp. TID into 4-8 oz of any non-carbonated beverage or soft food (hot or cold)    Dispense:  500 g    Refill:  PRN    This is an OTC product. This prescription is to serve as a reminder to the patient as to our preference.   New Prescriptions   No medications on file   Medications administered during this visit: Louis Wells had no medications administered during this visit. Lab-work, Procedure(s), & Referral(s) Ordered: Orders Placed This Encounter  Procedures  . KNEE INJECTION  . DG Knee 1-2 Views Left  . DG Knee 1-2 Views Right   Imaging & Referral(s) Ordered: DG KNEE 1-2 VIEWS LEFT DG KNEE 1-2 VIEWS RIGHT  Interventional Therapies: Scheduled: Diagnostic bilateral Hyalgan knee injections, without fluoroscopy or IV sedation. Stop Elaquis 3 days prior to procedure.    Considering: Palliative right intra-articular hip joint injection under fluoroscopic guidance, without IV sedation. Possible right hip joint radiofrequency  ablation under fluoroscopic guidance and IV sedation. Palliative intra-articular right knee injection, without fluoroscopy or IV sedation. Diagnostic right-sided genicular nerve block under fluoroscopic guidance and IV sedation. Possible right sided genicular nerve radiofrequency ablation under fluoroscopic guidance and IV sedation.    PRN Procedures: Palliative right intra-articular hip joint  injection under fluoroscopic guidance, without IV sedation. Palliative intra-articular right knee injection, without fluoroscopy or IV sedation. Diagnostic right-sided genicular nerve block under fluoroscopic guidance and IV sedation   Requested PM Follow-up: Return in 3 months (on 02/13/2016) for Med-Mgmt.  Future Appointments Date Time Provider Department Center  11/13/2015 1:20 PM Delano Metz, MD [facility] None  02/13/2016 1:00 PM Delano Metz, MD [facility] None   Primary Care Physician: No primary care provider on file. Location: [facility] Note by: Rhanda Lemire A. Laban Emperor, M.D, DABA, DABAPM, DABPM, DABIPP, FIPP  Pain Score Disclaimer: We use the NRS-11 scale. This is a self-reported, subjective measurement of pain severity with only modest accuracy. It is used primarily to identify changes within a particular patient. It must be understood that outpatient pain scales are significantly less accurate that those used for research, where they can be applied under ideal controlled circumstances with minimal exposure to variables. In reality, the score is likely to be a combination of pain intensity and pain affect, where pain affect describes the degree of emotional arousal or changes in action readiness caused by the sensory experience of pain. Factors such as social and work situation, setting, emotional state, anxiety levels, expectation, and prior pain experience may influence pain perception and show large inter-individual differences that may also be affected by  time variables.  Patient instructions provided during this appointment: Patient Instructions   Knee Injection A knee injection is a procedure to get medicine into your knee joint. Your health care provider puts a needle into the joint and injects medicine with an attached syringe. The injected medicine may relieve the pain, swelling, and stiffness of arthritis. The injected medicine may also help to lubricate and cushion your knee joint. You may need more than one injection. LET [facility] CARE PROVIDER KNOW ABOUT:  Any allergies you have.  All medicines you are taking, including vitamins, herbs, eye drops, creams, and over-the-counter medicines.  Previous problems you or members of your family have had with the use of anesthetics.  Any blood disorders you have.  Previous surgeries you have had.  Any medical conditions you may have. RISKS AND COMPLICATIONS Generally, this is a safe procedure. However, problems may occur, including:  Infection.  Bleeding.  Worsening symptoms.  Damage to the area around your knee.  Allergic reaction to any of the medicines.  Skin reactions from repeated injections. BEFORE THE PROCEDURE  Ask your health care provider about changing or stopping your regular medicines. This is especially important if you are taking diabetes medicines or blood thinners.  Plan to have someone take you home after the procedure. PROCEDURE  You will sit or lie down in a position for your knee to be treated.  The skin over your kneecap will be cleaned with a germ-killing solution (antiseptic).  You will be given a medicine that numbs the area (local anesthetic). You may feel some stinging.  After your knee becomes numb, you will have a second injection. This is the medicine. This needle is carefully placed between your kneecap and your knee. The medicine is injected into the joint space.  At the end of the procedure, the needle will be removed.  A bandage  (dressing) may be placed over the injection site. The procedure may vary among health care providers and hospitals. AFTER THE PROCEDURE  You may have to move your knee through its full range of motion. This helps to get all of the medicine into your joint space.  Your  blood pressure, heart rate, breathing rate, and blood oxygen level will be monitored often until the medicines you were given have worn off.  You will be watched to make sure that you do not have a reaction to the injected medicine.   This information is not intended to replace advice given to you by your health care provider. Make sure you discuss any questions you have with your health care provider.   Document Released: 04/20/2006 Document Revised: 02/17/2014 Document Reviewed: 12/07/2013 Elsevier Interactive Patient Education 2016 [employer] GENERAL RISKS AND COMPLICATIONS  What are the risk, side effects and possible complications? Generally speaking, most procedures are safe.  However, with any procedure there are risks, side effects, and the possibility of complications.  The risks and complications are dependent upon the sites that are lesioned, or the type of nerve block to be performed.  The closer the procedure is to the spine, the more serious the risks are.  Great care is taken when placing the radio frequency needles, block needles or lesioning probes, but sometimes complications can occur. 1. Infection: Any time there is an injection through the skin, there is a risk of infection.  This is why sterile conditions are used for these blocks.  There are four possible types of infection. 1. Localized skin infection. 2. Central Nervous System Infection-This can be in the form of Meningitis, which can be deadly. 3. Epidural Infections-This can be in the form of an epidural abscess, which can cause pressure inside of the spine, causing compression of the spinal cord with subsequent paralysis. This would require an  emergency surgery to decompress, and there are no guarantees that the patient would recover from the paralysis. 4. Discitis-This is an infection of the intervertebral discs.  It occurs in about 1% of discography procedures.  It is difficult to treat and it may lead to surgery.        2. Pain: the needles have to go through skin and soft tissues, will cause soreness.       3. Damage to internal structures:  The nerves to be lesioned may be near blood vessels or    other nerves which can be potentially damaged.       4. Bleeding: Bleeding is more common if the patient is taking blood thinners such as  aspirin, Coumadin, Ticiid, Plavix, etc., or if he/she have some genetic predisposition  such as hemophilia. Bleeding into the spinal canal can cause compression of the spinal  cord with subsequent paralysis.  This would require an emergency surgery to  decompress and there are no guarantees that the patient would recover from the  paralysis.       5. Pneumothorax:  Puncturing of a lung is a possibility, every time a needle is introduced in  the area of the chest or upper back.  Pneumothorax refers to free air around the  collapsed lung(s), inside of the thoracic cavity (chest cavity).  Another two possible  complications related to a similar event would include: Hemothorax and Chylothorax.   These are variations of the Pneumothorax, where instead of air around the collapsed  lung(s), you may have blood or chyle, respectively.       6. Spinal headaches: They may occur with any procedures in the area of the spine.       7. Persistent CSF (Cerebro-Spinal Fluid) leakage: This is a rare problem, but may occur  with prolonged intrathecal or epidural catheters either due to the formation of a fistulous  track or a dural tear.       8. Nerve damage: By working so close to the spinal cord, there is always a possibility of  nerve damage, which could be as serious as a permanent spinal cord injury with  paralysis.        9. Death:  Although rare, severe deadly allergic reactions known as "Anaphylactic  reaction" can occur to any of the medications used.      10. Worsening of the symptoms:  We can always make thing worse.  What are the chances of something like this happening? Chances of any of this occuring are extremely low.  By statistics, you have more of a chance of getting killed in a motor vehicle accident: while driving to the hospital than any of the above occurring .  Nevertheless, you should be aware that they are possibilities.  In general, it is similar to taking a shower.  Everybody knows that you can slip, hit your head and get killed.  Does that mean that you should not shower again?  Nevertheless always keep in mind that statistics do not mean anything if you happen to be on the wrong side of them.  Even if a procedure has a 1 (one) in a 1,000,000 (million) chance of going wrong, it you happen to be that one..Also, keep in mind that by statistics, you have more of a chance of having something go wrong when taking medications.  Who should not have this procedure? If you are on a blood thinning medication (e.g. Coumadin, Plavix, see list of "Blood Thinners"), or if you have an active infection going on, you should not have the procedure.  If you are taking any blood thinners, please inform your physician.  How should I prepare for this procedure?  Do not eat or drink anything at least six hours prior to the procedure.  Bring a driver with you .  It cannot be a taxi.  Come accompanied by an adult that can drive you back, and that is strong enough to help you if your legs get weak or numb from the local anesthetic.  Take all of your medicines the morning of the procedure with just enough water to swallow them.  If you have diabetes, make sure that you are scheduled to have your procedure done first thing in the morning, whenever possible.  If you have diabetes, take only half of your insulin dose and  notify our nurse that you have done so as soon as you arrive at the clinic.  If you are diabetic, but only take blood sugar pills (oral hypoglycemic), then do not take them on the morning of your procedure.  You may take them after you have had the procedure.  Do not take aspirin or any aspirin-containing medications, at least eleven (11) days prior to the procedure.  They may prolong bleeding.  Wear loose fitting clothing that may be easy to take off and that you would not mind if it got stained with Betadine or blood.  Do not wear any jewelry or perfume  Remove any nail coloring.  It will interfere with some of our monitoring equipment.  NOTE: Remember that this is not meant to be interpreted as a complete list of all possible complications.  Unforeseen problems may occur.  BLOOD THINNERS The following drugs contain aspirin or other products, which can cause increased bleeding during surgery and should not be taken for 2 weeks prior to and 1 week after surgery.  If you should need take something for relief of minor pain, you may take acetaminophen which is found in Tylenol,m Datril, Anacin-3 and Panadol. It is not blood thinner. The products listed below are.  Do not take any of the products listed below in addition to any listed on your instruction sheet.  A.P.C or A.P.C with Codeine Codeine Phosphate Capsules #3 Ibuprofen Ridaura  ABC compound Congesprin Imuran rimadil  Advil Cope Indocin Robaxisal  Alka-Seltzer Effervescent Pain Reliever and Antacid Coricidin or Coricidin-D  Indomethacin Rufen  Alka-Seltzer plus Cold Medicine Cosprin Ketoprofen S-A-C Tablets  Anacin Analgesic Tablets or Capsules Coumadin Korlgesic Salflex  Anacin Extra Strength Analgesic tablets or capsules CP-2 Tablets Lanoril Salicylate  Anaprox Cuprimine Capsules Levenox Salocol  Anexsia-D Dalteparin Magan Salsalate  Anodynos Darvon compound Magnesium Salicylate Sine-off  Ansaid Dasin Capsules Magsal Sodium  Salicylate  Anturane Depen Capsules Marnal Soma  APF Arthritis pain formula Dewitt's Pills Measurin Stanback  Argesic Dia-Gesic Meclofenamic Sulfinpyrazone  Arthritis Bayer Timed Release Aspirin Diclofenac Meclomen Sulindac  Arthritis pain formula Anacin Dicumarol Medipren Supac  Analgesic (Safety coated) Arthralgen Diffunasal Mefanamic Suprofen  Arthritis Strength Bufferin Dihydrocodeine Mepro Compound Suprol  Arthropan liquid Dopirydamole Methcarbomol with Aspirin Synalgos  ASA tablets/Enseals Disalcid Micrainin Tagament  Ascriptin Doan's Midol Talwin  Ascriptin A/D Dolene Mobidin Tanderil  Ascriptin Extra Strength Dolobid Moblgesic Ticlid  Ascriptin with Codeine Doloprin or Doloprin with Codeine Momentum Tolectin  Asperbuf Duoprin Mono-gesic Trendar  Aspergum Duradyne Motrin or Motrin IB Triminicin  Aspirin plain, buffered or enteric coated Durasal Myochrisine Trigesic  Aspirin Suppositories Easprin Nalfon Trillsate  Aspirin with Codeine Ecotrin Regular or Extra Strength Naprosyn Uracel  Atromid-S Efficin Naproxen Ursinus  Auranofin Capsules Elmiron Neocylate Vanquish  Axotal Emagrin Norgesic Verin  Azathioprine Empirin or Empirin with Codeine Normiflo Vitamin E  Azolid Emprazil Nuprin Voltaren  Bayer Aspirin plain, buffered or children's or timed BC Tablets or powders Encaprin Orgaran Warfarin Sodium  Buff-a-Comp Enoxaparin Orudis Zorpin  Buff-a-Comp with Codeine Equegesic Os-Cal-Gesic   Buffaprin Excedrin plain, buffered or Extra Strength Oxalid   Bufferin Arthritis Strength Feldene Oxphenbutazone   Bufferin plain or Extra Strength Feldene Capsules Oxycodone with Aspirin   Bufferin with Codeine Fenoprofen Fenoprofen Pabalate or Pabalate-SF   Buffets II Flogesic Panagesic   Buffinol plain or Extra Strength Florinal or Florinal with Codeine Panwarfarin   Buf-Tabs Flurbiprofen Penicillamine   Butalbital Compound Four-way cold tablets Penicillin   Butazolidin Fragmin Pepto-Bismol    Carbenicillin Geminisyn Percodan   Carna Arthritis Reliever Geopen Persantine   Carprofen Gold's salt Persistin   Chloramphenicol Goody's Phenylbutazone   Chloromycetin Haltrain Piroxlcam   Clmetidine heparin Plaquenil   Cllnoril Hyco-pap Ponstel   Clofibrate Hydroxy chloroquine Propoxyphen         Before stopping any of these medications, be sure to consult the physician who ordered them.  Some, such as Coumadin (Warfarin) are ordered to prevent or treat serious conditions such as "deep thrombosis", "pumonary embolisms", and other heart problems.  The amount of time that you may need off of the medication may also vary with the medication and the reason for which you were taking it.  If you are taking any of these medications, please make sure you notify your pain physician before you undergo any procedures.

## 2015-11-13 ENCOUNTER — Ambulatory Visit: Payer: Medicare Other | Attending: Pain Medicine | Admitting: Pain Medicine

## 2015-11-13 ENCOUNTER — Encounter: Payer: Self-pay | Admitting: Pain Medicine

## 2015-11-13 VITALS — BP 110/70 | HR 87 | Temp 98.6°F | Resp 20 | Ht 72.0 in | Wt 193.0 lb

## 2015-11-13 DIAGNOSIS — Z880 Allergy status to penicillin: Secondary | ICD-10-CM | POA: Diagnosis not present

## 2015-11-13 DIAGNOSIS — M1711 Unilateral primary osteoarthritis, right knee: Secondary | ICD-10-CM | POA: Diagnosis present

## 2015-11-13 DIAGNOSIS — Z955 Presence of coronary angioplasty implant and graft: Secondary | ICD-10-CM | POA: Insufficient documentation

## 2015-11-13 DIAGNOSIS — Z91041 Radiographic dye allergy status: Secondary | ICD-10-CM | POA: Insufficient documentation

## 2015-11-13 DIAGNOSIS — M25561 Pain in right knee: Secondary | ICD-10-CM

## 2015-11-13 DIAGNOSIS — M25562 Pain in left knee: Secondary | ICD-10-CM

## 2015-11-13 DIAGNOSIS — G8929 Other chronic pain: Secondary | ICD-10-CM | POA: Diagnosis not present

## 2015-11-13 MED ORDER — SODIUM HYALURONATE (VISCOSUP) 20 MG/2ML IX SOSY
2.0000 mL | PREFILLED_SYRINGE | Freq: Once | INTRA_ARTICULAR | Status: AC
Start: 2015-11-13 — End: 2015-11-13
  Administered 2015-11-13: 2 mL via INTRA_ARTICULAR

## 2015-11-13 MED ORDER — ROPIVACAINE HCL 2 MG/ML IJ SOLN: 9.0000 mL | Freq: Once | INTRAMUSCULAR | Status: DC

## 2015-11-13 MED ORDER — LIDOCAINE HCL (PF) 1 % IJ SOLN: 10.0000 mL | Freq: Once | INTRAMUSCULAR | Status: DC

## 2015-11-13 MED ORDER — LIDOCAINE HCL (PF) 1 % IJ SOLN
INTRAMUSCULAR | Status: AC
  Administered 2015-11-13: 15:00:00
  Filled 2015-11-13: qty 5

## 2015-11-13 NOTE — Progress Notes (Signed)
Patient's Name: Louis Wells  MRN: [id number]  Referring Provider: Milinda Pointer, MD  DOB: Apr 30, 1932  PCP: Gaspar Cola, MD  DOS: 11/13/2015  Note by: Kathlen Brunswick. Dossie Arbour, MD  Service setting: Ambulatory outpatient  Location: [facility] (AMB) Pain Management Facility  Visit type: Procedure  Specialty: Interventional Pain Management  Patient type: Established   Primary Reason for Visit: Interventional Pain Management Treatment. CC: Knee Pain (bilateral)  Procedure:  Anesthesia, Analgesia, Anxiolysis:  Type: Therapeutic Intra-Articular Knee Injection (Hyalgan) Region: Lateral  Knee Region Level: Knee Joint Laterality: Right  Type: Local Anesthesia Local Anesthetic: Lidocaine 1% Route: Infiltration (Wheeler/IM) IV Access: Declined Sedation: Declined  Indication(s): Analgesia        Indications: 1. Osteoarthritis of right knee, unspecified osteoarthritis type   2. Chronic knee pain (Location of Secondary source of pain) (Bilateral) (R>L)    Pain Score: Pre-procedure: 4 /10 Post-procedure: 0-No pain/10  Pre-Procedure Assessment:  Louis Wells is a 82 y.o. (year old), male patient, seen today for interventional treatment. He  has a past surgical history that includes Coronary angioplasty with stent.. His primarily concern today is the Knee Pain (bilateral) The primary encounter diagnosis was Osteoarthritis of right knee, unspecified osteoarthritis type. A diagnosis of Chronic knee pain (Location of Secondary source of pain) (Bilateral) (R>L) was also pertinent to this visit.  Pain Type: Chronic pain Pain Location: Knee Pain Orientation: Right, Left (right knee worse) Pain Descriptors / Indicators: Aching Pain Frequency: Constant  Date of Last Visit: 11/07/15 Service Provided on Last Visit: Med Refill  Coagulation Parameters No results found for: INR, LABPROT, APTT, PLT Verification of the correct person, correct site (including marking of site), and correct procedure were  performed and confirmed by the patient.  Consent: Before the procedure and under the influence of no sedative(s), amnesic(s), or anxiolytics, the patient was informed of the treatment options, risks and possible complications. To fulfill our ethical and legal obligations, as recommended by the American Medical Association's Code of Ethics, I have informed the patient of my clinical impression; the nature and purpose of the treatment or procedure; the risks, benefits, and possible complications of the intervention; the alternatives, including doing nothing; the risk(s) and benefit(s) of the alternative treatment(s) or procedure(s); and the risk(s) and benefit(s) of doing nothing. The patient was provided information about the general risks and possible complications associated with the procedure. These may include, but are not limited to: failure to achieve desired goals, infection, bleeding, organ or nerve damage, allergic reactions, paralysis, and death. In addition, the patient was informed of those risks and complications associated to Spine-related procedures, such as failure to decrease pain; infection (i.e.: Meningitis, epidural or intraspinal abscess); bleeding (i.e.: epidural hematoma, subarachnoid hemorrhage, or any other type of intraspinal or peri-dural bleeding); organ or nerve damage (i.e.: Any type of peripheral nerve, nerve root, or spinal cord injury) with subsequent damage to sensory, motor, and/or autonomic systems, resulting in permanent pain, numbness, and/or weakness of one or several areas of the body; allergic reactions; (i.e.: anaphylactic reaction); and/or death. Furthermore, the patient was informed of those risks and complications associated with the medications. These include, but are not limited to: allergic reactions (i.e.: anaphylactic or anaphylactoid reaction(s)); adrenal axis suppression; blood sugar elevation that in diabetics may result in ketoacidosis or comma; water  retention that in patients with history of congestive heart failure may result in shortness of breath, pulmonary edema, and decompensation with resultant heart failure; weight gain; swelling or edema; medication-induced neural toxicity; particulate matter  embolism and blood vessel occlusion with resultant organ, and/or nervous system infarction; and/or aseptic necrosis of one or more joints. Finally, the patient was informed that Medicine is not an exact science; therefore, there is also the possibility of unforeseen or unpredictable risks and/or possible complications that may result in a catastrophic outcome. The patient indicated having understood very clearly. We have given the patient no guarantees and we have made no promises. Enough time was given to the patient to ask questions, all of which were answered to the patient's satisfaction. Louis Wells has indicated that he wanted to continue with the procedure.  Consent Attestation: I, the ordering provider, attest that I have discussed with the patient the benefits, risks, side-effects, alternatives, likelihood of achieving goals, and potential problems during recovery for the procedure that I have provided informed consent.  Pre-Procedure Preparation:  Safety Precautions: Allergies reviewed. The patient was asked about blood thinners, or active infections, both of which were denied. The patient was asked to confirm the procedure and laterality, before marking the site, and again before commencing the procedure. Appropriate site, procedure, and patient were confirmed by following the Joint Commission's Universal Protocol (UP.01.01.01), in the form of a "Time Out". The patient was asked to participate by confirming the accuracy of the "Time Out" information. Patient was assessed for positional comfort and pressure points before starting the procedure. Allergies: He is allergic to contrast media [iodinated diagnostic agents] and amoxicillin.. Allergy  Precautions: None required Infection Control Precautions: Sterile technique used. Standard Universal Precautions were taken as recommended by the Department of [facility] for Disease Control and Prevention (CDC). Standard pre-surgical skin prep was conducted. Respiratory hygiene and cough etiquette was practiced. Hand hygiene observed. Safe injection practices and needle disposal techniques followed. SDV (single dose vial) medications used. Medications properly checked for expiration dates and contaminants. Personal protective equipment (PPE) used as per protocol. Monitoring:  As per clinic protocol. Vitals:   11/13/15 1403 11/13/15 1448 11/13/15 1500 11/13/15 1504  BP: 97/61 103/64 116/84 110/70  Pulse: 82 83 79 87  Resp: 20 20 20 20   Temp: 98.6 F (37 C)     TempSrc: Oral     SpO2: 99% 98% 98% 98%  Weight: 193 lb (87.5 kg)     Height: 6' (1.829 m)     Calculated BMI: Body mass index is 26.18 kg/m. Time-out: "Time-out" completed before starting procedure, as per protocol.  Description of Procedure Process:   Time-out: "Time-out" completed before starting procedure, as per protocol. Position: Sitting Target Area: Knee Joint Approach: Lateral approach. Area Prepped: Entire knee area, from the mid-thigh to the mid-shin. Prepping solution: ChloraPrep (2% chlorhexidine gluconate and 70% isopropyl alcohol) Safety Precautions: Aspiration looking for blood return was conducted prior to all injections. At no point did we inject any substances, as a needle was being advanced. No attempts were made at seeking any paresthesias. Safe injection practices and needle disposal techniques used. Medications properly checked for expiration dates. SDV (single dose vial) medications used.    Description of the Procedure: Protocol guidelines were followed. The patient was placed in position over the fluoroscopy table. The target area was identified and the area prepped in the usual manner. Skin  desensitized using vapocoolant spray. Skin & deeper tissues infiltrated with local anesthetic. Appropriate amount of time allowed to pass for local anesthetics to take effect. The procedure needles were then advanced to the target area. Proper needle placement secured. Negative aspiration confirmed. Solution injected in intermittent fashion, asking  for systemic symptoms every 0.5cc of injectate. The needles were then removed and the area cleansed, making sure to leave some of the prepping solution back to take advantage of its long term bactericidal properties. EBL: None Materials & Medications Used:  Needle(s) Used: 22g - 1.5" Needle(s) Medications Administered today: We administered lidocaine (PF) and Sodium Hyaluronate.Please see chart orders for dosing details.  Imaging Guidance:   Type of Imaging Technique: None  Antibiotic Prophylaxis:  Indication(s): No indications identified. Type:  Antibiotics Given (last 72 hours)    None       Post-operative Assessment:   Complications: No immediate post-treatment complications were observed. Disposition: Return to clinic for follow-up evaluation. The patient tolerated the entire procedure well. A repeat set of vitals were taken after the procedure and the patient was kept under observation following institutional policy, for this type of procedure. The patient was discharged home, once institutional criteria were met. The patient was provided with post-procedure discharge instructions, including a section on how to identify potential problems. Should any problems arise concerning this procedure, the patient was given instructions to immediately contact us, at any time, without hesitation. In any case, we plan to contact the patient by telephone for a follow-up status report regarding this interventional procedure. Comments:  No additional relevant information.  Plan of Care   Problem List Items Addressed This Visit      High   Chronic knee pain  (Location of Secondary source of pain) (Bilateral) (R>L) (Chronic)   Relevant Medications   lidocaine (PF) (XYLOCAINE) 1 % injection (Completed)   Sodium Hyaluronate SOSY 2 mL (Completed) (Start on 11/13/2015  3:45 PM)   lidocaine (PF) (XYLOCAINE) 1 % injection 10 mL (Start on 11/13/2015  3:45 PM)   ropivacaine (PF) 2 mg/ml (0.2%) (NAROPIN) epidural 9 mL (Start on 11/13/2015  3:45 PM)   Osteoarthritis of knee (Location of Secondary source of pain) (Right) - Primary (Chronic)   Relevant Medications   Sodium Hyaluronate SOSY 2 mL (Completed) (Start on 11/13/2015  3:45 PM)   lidocaine (PF) (XYLOCAINE) 1 % injection 10 mL (Start on 11/13/2015  3:45 PM)   ropivacaine (PF) 2 mg/ml (0.2%) (NAROPIN) epidural 9 mL (Start on 11/13/2015  3:45 PM)    Other Visit Diagnoses   None.     Requested PM Follow-up: No Follow-up on file.  Future Appointments Date Time Provider Addis  11/27/2015 1:00 PM Milinda Pointer, MD [facility] None  02/14/2016 1:00 PM Milinda Pointer, MD [facility] None    Primary Care Physician: Gaspar Cola, MD Location: [facility] Note by: Kathlen Brunswick. Dossie Arbour, M.D, DABA, DABAPM, DABPM, DABIPP, FIPP  Disclaimer:  Medicine is not an exact science. The only guarantee in medicine is that nothing is guaranteed. It is important to note that the decision to proceed with this intervention was based on the information collected from the patient. The Data and conclusions were drawn from the patient's questionnaire, the interview, and the physical examination. Because the information was provided in large part by the patient, it cannot be guaranteed that it has not been purposely or unconsciously manipulated. Every effort has been made to obtain as much relevant data as possible for this evaluation. It is important to note that the conclusions that lead to this procedure are derived in large part from the available data. Always take into account  that the treatment will also be dependent on availability of resources and existing treatment guidelines, considered by other Pain Management Practitioners  as being common knowledge and practice, at the time of the intervention. For Medico-Legal purposes, it is also important to point out that variation in procedural techniques and pharmacological choices are the acceptable norm. The indications, contraindications, technique, and results of the above procedure should only be interpreted and judged by a Board-Certified Interventional Pain Specialist with extensive familiarity and expertise in the same exact procedure and technique. Attempts at providing opinions without similar or greater experience and expertise than that of the treating physician will be considered as inappropriate and unethical, and shall result in a formal complaint to the state medical board and applicable specialty societies. Daisyreviewed the middle ear

## 2015-11-13 NOTE — Patient Instructions (Signed)

## 2015-11-14 ENCOUNTER — Telehealth: Payer: Self-pay | Admitting: *Deleted

## 2015-11-14 NOTE — Telephone Encounter (Signed)
No answer. Left voicemail for patient to call for any concerns/questions.

## 2015-11-16 ENCOUNTER — Telehealth: Payer: Self-pay | Admitting: Pain Medicine

## 2015-11-16 NOTE — Telephone Encounter (Signed)
Spoke with patients daughter Dorann LodgeJuanita.  States that patient starting hurting the morning after his right knee injection.  States his right knee is extremely swollen from the knee down.  States he cant bear weight on it and he cant straighten it out.  States the knot behind his knee has gotten bigger.  Patient states he has never been in this much pain.  Daughter denies any redness or bruising, but states that his foot is hot compared to the other one.

## 2015-11-16 NOTE — Telephone Encounter (Signed)
Patients daughter called stating her father was ok yesterday morning then as day went on had increasing pain and still having this morning please call patient to discuss

## 2015-11-16 NOTE — Telephone Encounter (Signed)
Because he is anticoagulated, he may have developed a hemarthrosis (blood in the joint). Treatment: immobilization, particularly with acute posttraumatic hemarthrosis, ice, and compression are important first-aid measures. Potent analgesics may be required.

## 2015-11-16 NOTE — Progress Notes (Signed)
Results were reviewed and found to be: significantly abnormal  Surgical consultation is recommended  Review would suggest interventional pain management techniques may be of benefit 

## 2015-11-16 NOTE — Progress Notes (Signed)
Results were reviewed and found to be: abnormal  Surgical consultation is recommended  Review would suggest interventional pain management techniques may be of benefit 

## 2015-11-16 NOTE — Telephone Encounter (Signed)
Spoke with daughter and instructed her of Dr Leighton ParodyNaveiras recommendations.  Appointment made for Tuesday at 1245 for eval.  Instructed to go to ED over weekend if felt needed.  Instructed per Dr Laban EmperorNaveira that patient may take Hydrocodone up to qid if needed.

## 2015-11-20 ENCOUNTER — Encounter: Payer: Self-pay | Admitting: Pain Medicine

## 2015-11-20 ENCOUNTER — Ambulatory Visit: Payer: Medicare Other | Attending: Pain Medicine | Admitting: Pain Medicine

## 2015-11-20 ENCOUNTER — Telehealth: Payer: Self-pay

## 2015-11-20 VITALS — BP 94/63 | HR 80 | Temp 98.0°F | Resp 18 | Ht 72.0 in | Wt 193.0 lb

## 2015-11-20 DIAGNOSIS — Z91041 Radiographic dye allergy status: Secondary | ICD-10-CM | POA: Insufficient documentation

## 2015-11-20 DIAGNOSIS — M1610 Unilateral primary osteoarthritis, unspecified hip: Secondary | ICD-10-CM | POA: Insufficient documentation

## 2015-11-20 DIAGNOSIS — M25561 Pain in right knee: Secondary | ICD-10-CM | POA: Insufficient documentation

## 2015-11-20 DIAGNOSIS — Z833 Family history of diabetes mellitus: Secondary | ICD-10-CM | POA: Insufficient documentation

## 2015-11-20 DIAGNOSIS — G894 Chronic pain syndrome: Secondary | ICD-10-CM | POA: Diagnosis present

## 2015-11-20 DIAGNOSIS — N183 Chronic kidney disease, stage 3 (moderate): Secondary | ICD-10-CM | POA: Insufficient documentation

## 2015-11-20 DIAGNOSIS — E785 Hyperlipidemia, unspecified: Secondary | ICD-10-CM | POA: Diagnosis not present

## 2015-11-20 DIAGNOSIS — M25562 Pain in left knee: Secondary | ICD-10-CM | POA: Diagnosis not present

## 2015-11-20 DIAGNOSIS — Z79891 Long term (current) use of opiate analgesic: Secondary | ICD-10-CM | POA: Diagnosis not present

## 2015-11-20 DIAGNOSIS — Z809 Family history of malignant neoplasm, unspecified: Secondary | ICD-10-CM | POA: Insufficient documentation

## 2015-11-20 DIAGNOSIS — Z880 Allergy status to penicillin: Secondary | ICD-10-CM | POA: Insufficient documentation

## 2015-11-20 DIAGNOSIS — Z87891 Personal history of nicotine dependence: Secondary | ICD-10-CM | POA: Diagnosis not present

## 2015-11-20 DIAGNOSIS — M7121 Synovial cyst of popliteal space [Baker], right knee: Secondary | ICD-10-CM | POA: Insufficient documentation

## 2015-11-20 DIAGNOSIS — Z955 Presence of coronary angioplasty implant and graft: Secondary | ICD-10-CM | POA: Insufficient documentation

## 2015-11-20 DIAGNOSIS — M25061 Hemarthrosis, right knee: Secondary | ICD-10-CM

## 2015-11-20 DIAGNOSIS — Z7901 Long term (current) use of anticoagulants: Secondary | ICD-10-CM | POA: Insufficient documentation

## 2015-11-20 MED ORDER — OXYCODONE HCL 5 MG PO TABS: 5.0000 mg | ORAL_TABLET | Freq: Four times a day (QID) | ORAL | 0 refills | Status: DC | PRN

## 2015-11-20 NOTE — Telephone Encounter (Signed)
Scripts for oxycodone x 3 discontinued per Dr Laban EmperorNaveira.  Spoke with Revonda StandardAllison.

## 2015-11-20 NOTE — Progress Notes (Signed)
Safety precautions to be maintained throughout the outpatient stay will include: orient to surroundings, keep bed in low position, maintain call bell within reach at all times, provide assistance with transfer out of bed and ambulation.  

## 2015-11-20 NOTE — Progress Notes (Signed)
Patient's Name: Louis Wells  MRN: [id number]  Referring Provider: Delano Metz, MD  DOB: [birth year]  PCP: Oswaldo Done, MD  DOS: 11/20/2015  Note by: Sydnee Levans. Laban Emperor, MD  Service setting: Ambulatory outpatient  Specialty: Interventional Pain Management  Location: [facility] (AMB) Pain Management Facility    Patient type: Established   Primary Reason(s) for Visit: Encounter for prescription drug management & post-procedure evaluation of chronic illness with mild to moderate exacerbation(Level of risk: moderate) CC: Knee Pain  HPI  Louis Wells is an 82 y.o. year old, male patient, who comes today for an initial evaluation. He has A-fib (HCC); Acute myocardial infarction of anterior wall (HCC); Arteriosclerosis of coronary artery; Chronic kidney disease, stage III (moderate); Long term current use of opiate analgesic; History of cardiac catheterization; BP (high blood pressure); Hyperlipidemia; Hypothyroidism; Chronic pain; Long term prescription opiate use; Opiate use (10 MME/Day); Encounter for therapeutic drug level monitoring; Disturbance of skin sensation; Pain Hip (Right); Pain in ankle; Chronic knee pain (Location of Secondary source of pain) (Bilateral) (R>L); Osteoarthritis of hip (Location of Primary Source of Pain) (Right); Osteoarthritis of ankle (Location of Tertiary source of pain) (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 His primarily concern today is the Knee Pain  Pain Assessment: Self-Reported Pain Score: 4 /10             Reported level is compatible with observation.       Pain Type: Chronic pain Pain Location: Knee Pain  Orientation: Right Pain Descriptors / Indicators: Aching Pain Frequency: Constant  The patient comes into the clinics today for post-procedure evaluation on the interventional treatment done on 11/16/2015. In addition, he comes in today for pharmacological management of his chronic pain.  The patient  reports that he does not use drugs. Increased pain after aspiration of right knee hemarthrosis.  Date of Last Visit: 11/13/15 Service Provided on Last Visit: Procedure (intraarticular knee injection )  Controlled Substance Pharmacotherapy Assessment & REMS (Risk Evaluation and Mitigation Strategy)  Analgesic: Oxycodone IR 5 mg 1 tablet every 6 hours when necessary for pain (20 mg per day of oxycodone) MME/day: 30 mg/day.  Pill Count: The patient did not bring his pills to the appointments since this was not a refill appointment. However because of his increased pain we have called the pharmacy and canceled his prior prescriptions and we have given him know once. Pharmacokinetics: Onset of action (Liberation/Absorption): Within expected pharmacological parameters Time to Peak effect (Distribution): Timing and results are as within normal expected parameters Duration of action (Metabolism/Excretion): Within normal limits for medication Pharmacodynamics: Analgesic Effect: More than 50% Activity Facilitation: Medication(s) allow patient to sit, stand, walk, and do the basic ADLs Perceived Effectiveness: Described as relatively effective, allowing for increase in activities of daily living (ADL) Side-effects or Adverse reactions: None reported Monitoring: [place] PMP: Online review of the past 63-month period conducted. Compliant with practice rules and regulations List of all UDS test(s) done:  Lab Results  Component Value Date   TOXASSSELUR FINAL 07/03/2015   Last UDS on record: ToxAssure Select 13  Date Value Ref Range Status  07/03/2015 FINAL  Final    Comment:     ==================================================================== TOXASSURE SELECT 13 (  MW) ==================================================================== Test                             Result       Flag       Units Drug Present and Declared for Prescription Verification   Hydrocodone                    432          EXPECTED   ng/mg creat   Dihydrocodeine                 65           EXPECTED   ng/mg creat   Norhydrocodone                 537          EXPECTED   ng/mg creat    Sources of hydrocodone include scheduled prescription    medications. Dihydrocodeine and norhydrocodone are expected    metabolites of hydrocodone. Dihydrocodeine is also available as a    scheduled prescription medication. ==================================================================== Test                      Result    Flag   Units      Ref Range   Creatinine              171              mg/dL      >=08 ==================================================================== Declared Medications:  The flagging and interpretation on this report are based on the  following declared medications.  Unexpected results may arise from  inaccuracies in the declared medications.  **Note: The testing scope of this panel includes these medications:  Hydrocodone (Hydrocodone-Acetaminophen)  **Note: The testing scope of this panel does not include following  reported medications:  Acetaminophen (Hydrocodone-Acetaminophen)  Apixaban (Eliquis)  Cyanocobalamin  Diclofenac (Voltaren)  Famotidine (Pepcid)  Levothyroxine  Lisinopril  Meloxicam (Mobic)  Metoprolol  Nitroglycerin ==================================================================== For clinical consultation, please call [phone]. ====================================================================    UDS interpretation: Compliant          Medication Assessment Form: Reviewed. Patient indicates being compliant with therapy Treatment  compliance: Compliant Risk Assessment: Aberrant Behavior: None observed today Substance Use Disorder (SUD) Risk Level: Low Risk of opioid abuse or dependence: 0.7-3.0% with doses ? 36 MME/day and 6.1-26% with doses ? 120 MME/day. Opioid Risk Tool (ORT) Score:  0 Low Risk for SUD (Score <3) Depression Scale Score: PHQ-2: 0 No depression (0) PHQ-9: 0 No depression (0-4) Risk Mitigation Strategies:  Patient Counseling:  Completed Patient-Prescriber Agreement (PPA): Present and active  Notification to other healthcare providers: Done   Pharmacologic Plan: No change in therapy, at this time  Post-Procedure Assessment  Procedure done on 11/16/2015: Aspiration of right knee hemarthrosis, without fluoroscopy or IV sedation + injection of local anesthetic and steroid. Complications experienced at the time of the procedure: None Side-effects or Adverse reactions: Transient post-op increase in pain Sedation: No sedation used. When no sedatives are used, the analgesic levels obtained are directly associated with the effectiveness of the local anesthetics. On the other hand, when sedation is provided, the level of analgesia obtained during the initial 1 hour, immediately following the intervention, is believed to be the result of a combination of factors. These factors may include, but are not limited to: 1. The effectiveness of the local anesthetics used.  2. The effects of the analgesic(s) and/or anxiolytic(s) used. 3. The degree of discomfort experienced by the patient at the time of the procedure. 4. The patients ability and reliability in recalling and recording the events. 5. The presence and influence of possible secondary gains. Results: Relief during the 1st hour after the procedure: 100 % (Ultra-Short Term Relief) Interpretative note: Analgesia during this period is likely to be Local Anesthetic and/or IV Sedative (Analgesic/Anxiolitic) related Local Anesthesia: Long-acting (4-6 hours)  anesthetics used. The analgesic levels attained during this period are directly associated to the localized infiltration of local anesthetics and therefore cary significant diagnostic value as to the etiological location or origin of the pain. Results: Relief during the next 4 to 6 hour after the procedure: 100 % (Short Term Relief) Interpretative note: Complete relief confirms area to be the source of pain Long-Term Therapy: Steroids used. Results: Extended relief following procedure: 0 % (next day after procedure no relief, pain was worse ) Interpretative note: No benefit could suggest etiology to be non-inflammatory, possibly mechanical compression or irritation         Long-Term Benefits:  Current Relief (Now): 0%  Interpretative note: Recurrance of symptoms after a period of improvement would suggest involvement of an inflammatory process with persistent aggravating factors Interpretation of Results: Continued irritation of the Betty articular tissue secondary to the chronic hemarthrosis.         Laboratory Chemistry  Inflammation Markers Lab Results  Component Value Date   ESRSEDRATE 26 (H) 07/03/2015   CRP 0.7 07/03/2015   Renal Function Lab Results  Component Value Date   BUN 32 (H) 07/03/2015   CREATININE 1.45 (H) 07/03/2015   GFRAA 51 (L) 07/03/2015   GFRNONAA 44 (L) 07/03/2015   Hepatic Function Lab Results  Component Value Date   AST 22 07/03/2015   ALT 11 (L) 07/03/2015   ALBUMIN 4.2 07/03/2015   Electrolytes Lab Results  Component Value Date   NA 137 07/03/2015   K 5.3 (H) 07/03/2015   CL 106 07/03/2015   CALCIUM 9.0 07/03/2015   MG 2.1 07/03/2015   Pain Modulating Vitamins Lab Results  Component Value Date   25OHVITD1 35 07/03/2015   25OHVITD2 <1.0 07/03/2015   25OHVITD3 34 07/03/2015   VITAMINB12 806 07/03/2015   Coagulation Parameters No results found for: INR, LABPROT, APTT, PLT Cardiovascular No results found for: BNP, HGB, HCT Note: Lab  results reviewed.  Recent Diagnostic Imaging  Dg Knee 1-2 Views Left  Result Date: 11/07/2015 CLINICAL DATA:  Bilateral chronic knee pain. EXAM: LEFT KNEE - 1-2 VIEW COMPARISON:  None. FINDINGS: Medial joint space narrowing with subchondral sclerosis. Slight lateral subluxation of the tibia with respect to the distal femur. Tricompartment osteophytosis. No joint effusion or fracture. IMPRESSION: Tricompartment osteoarthritis, worst in the medial compartment. Electronically Signed   By: Leanna Battles M.D.   On: 11/07/2015 16:32   Dg Knee 1-2 Views Right  Result Date: 11/07/2015 CLINICAL DATA:  Bilateral chronic knee pain. EXAM: RIGHT KNEE - 1-2 VIEW COMPARISON:  None. FINDINGS: Image quality on the frontal view is degraded by motion. Lateral joint space narrowing with subchondral sclerosis and prominent osteophytosis. Subchondral sclerosis and osteophytosis in the patellofemoral compartment. Small joint effusion. There may be a loose body in the posterior knee joint. IMPRESSION: 1. Advanced osteoarthritis in the medial and patellofemoral compartments. 2. Small joint effusion. 3. Cannot exclude a loose body in the posterior knee joint. Electronically Signed   By: Leanna Battles M.D.  On: 11/07/2015 16:36   Meds  The patient has a current medication list which includes the following prescription(s): acetaminophen, apixaban, cyanocobalamin, famotidine, levothyroxine, lisinopril, lubiprostone, metoprolol, metoprolol succinate, nitroglycerin, oxycodone, oxycodone, oxycodone, and benefiber.  Current Outpatient Prescriptions on File Prior to Visit  Medication Sig  . acetaminophen (TYLENOL) 325 MG tablet Take 650 mg by mouth 2 (two) times daily.  [name] apixaban (ELIQUIS) 5 MG TABS tablet Take by mouth.  . Cyanocobalamin (VITAMIN B 12 PO) Take 500 mg by mouth daily.  . famotidine (PEPCID) 20 MG tablet Take 20 mg by mouth 2 (two) times daily.  [name] levothyroxine (SYNTHROID, LEVOTHROID) 112 MCG tablet 112 mcg  daily before breakfast.   . lisinopril (PRINIVIL,ZESTRIL) 20 MG tablet Take 20 mg by mouth daily.  Melene Muller ON 11/27/2015] lubiprostone (AMITIZA) 8 MCG capsule Take 1 capsule (8 mcg total) by mouth 2 (two) times daily with a meal. Swallow the medication whole. Do not break or chew the medication.  . metoprolol (LOPRESSOR) 100 MG tablet Take 100 mg by mouth daily.  . nitroGLYCERIN (NITROSTAT) 0.4 MG SL tablet Place under the tongue.  Melene Muller ON 11/26/2015] Wheat Dextrin (BENEFIBER) POWD Stir 2 tsp. TID into 4-8 oz of any non-carbonated beverage or soft food (hot or cold)   No current facility-administered medications on file prior to visit.    ROS  Constitutional: Denies any fever or chills Gastrointestinal: No reported hemesis, hematochezia, vomiting, or acute GI distress Musculoskeletal: Denies any acute onset joint swelling, redness, loss of ROM, or weakness Neurological: No reported episodes of acute onset apraxia, aphasia, dysarthria, agnosia, amnesia, paralysis, loss of coordination, or loss of consciousness  Allergies  Louis Wells is allergic to contrast media [iodinated diagnostic agents] and amoxicillin.  PFSH  Medical:  Louis Wells  has a past medical history of Arthralgia of ankle or foot (11/20/2014); Atrial fibrillation (HCC); Degenerative arthritis of hip (04/05/2015); Hyperlipidemia; Hypertension; Myocardial infarction; Primary osteoarthritis of right knee (05/22/2015); and Thyroid disease. Family: family history includes Cancer in his mother; Diabetes in his father. Surgical:  has a past surgical history that includes Coronary angioplasty with stent. Tobacco:  reports that he has quit smoking. He has never used smokeless tobacco. Alcohol:  reports that he does not drink alcohol. Drug:  reports that he does not use drugs.  Constitutional Exam  General appearance: Well nourished, well developed, and well hydrated. In no acute distress Vitals:   11/20/15 1247  BP: 94/63   Pulse: 80  Resp: 18  Temp: 98 F (36.7 C)  SpO2: 97%  Weight: 193 lb (87.5 kg)  Height: 6' (1.829 m)  BMI Assessment: Estimated body mass index is 26.18 kg/m as calculated from the following:   Height as of this encounter: 6' (1.829 m).   Weight as of this encounter: 193 lb (87.5 kg).   BMI interpretation: (25-29.9 kg/m2) = Overweight: This range is associated with a 20% higher incidence of chronic pain. BMI Readings from Last 4 Encounters:  11/20/15 26.18 kg/m  11/13/15 26.18 kg/m  11/07/15 26.18 kg/m  09/06/15 26.18 kg/m   Wt Readings from Last 4 Encounters:  11/20/15 193 lb (87.5 kg)  11/13/15 193 lb (87.5 kg)  11/07/15 193 lb (87.5 kg)  09/06/15 193 lb (87.5 kg)  Psych/Mental status: Alert and oriented x 3 (person, place, & time) Eyes: PERLA Respiratory: No evidence of acute respiratory distress  Cervical Spine Exam  Inspection: No masses, redness, or swelling Alignment: Symmetrical Functional ROM: Unrestricted ROM Stability: No instability detected  Muscle strength & Tone: Functionally intact Sensory: Unimpaired Palpation: Non-contributory  Upper Extremity (UE) Exam    Side: Right upper extremity  Side: Left upper extremity  Inspection: No masses, redness, swelling, or asymmetry  Inspection: No masses, redness, swelling, or asymmetry  Functional ROM: Unrestricted ROM         Functional ROM: Unrestricted ROM          Muscle strength & Tone: Functionally intact  Muscle strength & Tone: Functionally intact  Sensory: Unimpaired  Sensory: Unimpaired  Palpation: Non-contributory  Palpation: Non-contributory   Thoracic Spine Exam  Inspection: No masses, redness, or swelling Alignment: Symmetrical Functional ROM: Unrestricted ROM Stability: No instability detected Sensory: Unimpaired Muscle strength & Tone: Functionally intact Palpation: Non-contributory  Lumbar Spine Exam  Inspection: No masses, redness, or swelling Alignment: Symmetrical Functional ROM:  Unrestricted ROM Stability: No instability detected Muscle strength & Tone: Functionally intact Sensory: Unimpaired Palpation: Non-contributory Provocative Tests: Lumbar Hyperextension and rotation test: evaluation deferred today       Patrick's Maneuver: evaluation deferred today              Gait & Posture Assessment  Ambulation: Unassisted Gait: Relatively normal for age and body habitus Posture: WNL   Lower Extremity Exam    Side: Right lower extremity  Side: Left lower extremity  Inspection: There is some increasing size of the right knee but with no redness or increased temperature. Clearly there is still fluid inside of the joint with bulging of an apparent Baker's cyst in the posterior aspect of the knee. There is decreased range of motion because of the discomfort.  Inspection: Chronically swollen knees due to severe osteoarthritis. The patient has been offered replacements but he is aware have any Surgeries.  Functional ROM: Limited ROM          Functional ROM: Limited ROM          Muscle strength & Tone: Guarding  Muscle strength & Tone: Guarding  Sensory: Movement-associated pain  Sensory: Movement-associated discomfort  Palpation: Tender  Palpation: Tender   Assessment  Primary Diagnosis & Pertinent Problem List: The primary encounter diagnosis was Knee hemarthrosis (Right). A diagnosis of Chronic pain syndrome was also pertinent to this visit.  Visit Diagnosis: 1. Knee hemarthrosis (Right)   2. Chronic pain syndrome    Plan of Care  Pharmacotherapy (Medications Ordered): Meds ordered this encounter  Medications  . oxyCODONE (OXY IR/ROXICODONE) 5 MG immediate release tablet    Sig: Take 1 tablet (5 mg total) by mouth every 6 (six) hours as needed for severe pain.    Dispense:  120 tablet    Refill:  0    Do not add this medication to the electronic "Automatic Refill" notification system. Patient may have prescription filled one day early if pharmacy is closed on  scheduled refill date. Do not fill until: 11/20/15 To last until: 12/20/15  . oxyCODONE (OXY IR/ROXICODONE) 5 MG immediate release tablet    Sig: Take 1 tablet (5 mg total) by mouth every 6 (six) hours as needed for severe pain.    Dispense:  120 tablet    Refill:  0    Do not add this medication to the electronic "Automatic Refill" notification system. Patient may have prescription filled one day early if pharmacy is closed on scheduled refill date. Do not fill until: 12/20/15 To last until: 01/19/16  . oxyCODONE (OXY IR/ROXICODONE) 5 MG immediate release tablet    Sig: Take 1 tablet (5 mg total) by  mouth every 6 (six) hours as needed for severe pain.    Dispense:  120 tablet    Refill:  0    Do not add this medication to the electronic "Automatic Refill" notification system. Patient may have prescription filled one day early if pharmacy is closed on scheduled refill date. Do not fill until: 01/19/16 To last until: 02/18/16   New Prescriptions   No medications on file   Medications administered during this visit: Louis Wells had no medications administered during this visit. Lab-work, Procedure(s), & Referral(s) Ordered: Orders Placed This Encounter  Procedures  . MR Knee Right Wo Contrast   Imaging & Referral(s) Ordered: MR KNEE RIGHT WO CONTRAST  Interventional Therapies: Scheduled:  None at this time.    Considering:  None at this time.    PRN Procedures:  None at this time.    Requested PM Follow-up: Return for Keep prior appointment for post-procedure evaluation in 2 weeks..  Future Appointments Date Time Provider Department Center  11/27/2015 1:00 PM Delano MetzFrancisco Lilton Pare, MD [facility] None  02/14/2016 1:00 PM Delano MetzFrancisco Noell Lorensen, MD [facility] None    Primary Care Physician: Oswaldo DoneFrancisco A Ceci Taliaferro, MD [facility] Note by: Sydnee LevansFrancisco A. Laban EmperorNaveira, M.D, DABA, DABAPM, DABPM, DABIPP, FIPP  Pain Score Disclaimer: We use the NRS-11 scale.  This is a self-reported, subjective measurement of pain severity with only modest accuracy. It is used primarily to identify changes within a particular patient. It must be understood that outpatient pain scales are significantly less accurate that those used for research, where they can be applied under ideal controlled circumstances with minimal exposure to variables. In reality, the score is likely to be a combination of pain intensity and pain affect, where pain affect describes the degree of emotional arousal or changes in action readiness caused by the sensory experience of pain. Factors such as social and work situation, setting, emotional state, anxiety levels, expectation, and prior pain experience may influence pain perception and show large inter-individual differences that may also be affected by time variables.  Patient instructions provided during this appointment: There are no Patient Instructions on file for this visit.

## 2015-11-27 ENCOUNTER — Ambulatory Visit: Payer: Medicare Other | Admitting: Pain Medicine

## 2015-12-03 ENCOUNTER — Telehealth: Payer: Self-pay

## 2015-12-03 ENCOUNTER — Ambulatory Visit
Admission: RE | Admit: 2015-12-03 | Discharge: 2015-12-03 | Disposition: A | Discharge status: Home / Self Care | Payer: Medicare Other | Source: Ambulatory Visit | Attending: Pain Medicine | Admitting: Pain Medicine

## 2015-12-03 DIAGNOSIS — M25061 Hemarthrosis, right knee: Secondary | ICD-10-CM

## 2015-12-03 NOTE — Telephone Encounter (Signed)
Patient could not have his MRI done today due to not being able to straighten out his leg, it hurt him to bad. His daughter wants you to call her about this.

## 2015-12-03 NOTE — Telephone Encounter (Signed)
Spoke with patient's daughter and she states that patient was unable to tolerate MRI today and positioning.  He was unable to straighten leg without severe pain and imaging was unable to be performed.  Daughter  states the patient remains unable to walk on it and pivots from wheelchair to get into car or other sitting areas.

## 2015-12-04 ENCOUNTER — Other Ambulatory Visit: Payer: Self-pay | Admitting: Pain Medicine

## 2015-12-04 DIAGNOSIS — M25061 Hemarthrosis, right knee: Secondary | ICD-10-CM

## 2015-12-04 DIAGNOSIS — M1711 Unilateral primary osteoarthritis, right knee: Secondary | ICD-10-CM

## 2015-12-04 DIAGNOSIS — M25461 Effusion, right knee: Secondary | ICD-10-CM

## 2015-12-04 NOTE — Telephone Encounter (Signed)
Spoke with patient's daughter. Let her know that we are in the process of finding out specifics of [place] so that patient will be able to be provided with sedation during imaging.  Juanita verbalizes u/o and that we will call her when have all specifics.

## 2015-12-05 NOTE — Telephone Encounter (Signed)
Spoke with patients daughter re; MRI appointment.  Patient is scheduled for December 20, 2015 @ 0800.  [facility] information as to when to report for workup appt, which will be scheduled a few days earlier than actual appt and then give all details of appt and where to report to.  Patient's daughter verbalizes u/o information.  Telephone # to MRI Scheduler [phone]

## 2015-12-06 ENCOUNTER — Telehealth: Payer: Self-pay | Admitting: Pain Medicine

## 2015-12-06 NOTE — Telephone Encounter (Signed)
Patients daughter states that they did not try the valium. Patient states she will talk to the patient and see if he would try taking the valium for the MRI and call us back.

## 2015-12-06 NOTE — Telephone Encounter (Signed)
Spoke with [place] about Mr [name] and his fear of being put to sleep.  [place] if he has to be put to sleep.  States that he will take a valium and a pain pill and he thinks he can hold still and straighten leg for procedure and [place].  Questioned the daughter, can he take a pain pill at home and straighten the affected leg and her response was no.  Daugter encouraged to rethink decision and that the first appt would be to talk with anesthesia re; the procedure or technique they would use for Mr [name] and then he could make a more informed decision re; having the procedure done.  Juanita verbalizes u/o information and that we have a little bit of time to think about this before cancelling.  Also, I told her that I would run this by Dr Laban EmperorNaveira so that he will be aware of concerns.

## 2015-12-06 NOTE — Telephone Encounter (Signed)
Spoke with MRI and they states that the only choices for sedation were General anesthesia, valium, or nothing.  Daughter notified. States patient will not have the general anesthesia due to having a fear of dying.

## 2015-12-06 NOTE — Telephone Encounter (Signed)
Patients daughter called stating her father does not want to be put to sleep for the MRI he has severe fear of not waking up. They were called for a pre-op appt for the MRI and he now does not want to be put to sleep. Please call patient to discuss

## 2015-12-09 NOTE — Telephone Encounter (Signed)
Cancel MRI. No point in doing MRI if surgery is not an option. We'll treat symptomatically.

## 2015-12-10 ENCOUNTER — Telehealth: Payer: Self-pay | Admitting: Pain Medicine

## 2015-12-10 NOTE — Telephone Encounter (Signed)
Called Louis Wells to let her know Dr Waynetta SandyNaveira's response re; cancelling MRI since surgery would not be an option.

## 2015-12-10 NOTE — Telephone Encounter (Signed)
MRI cancelled per patient request.  Patient would like to be referred to someone who can draw fluid off his knee to make him more comfortable.

## 2015-12-10 NOTE — Telephone Encounter (Signed)
Patient does not want to have surgery on knee. Wants to cancel MRI. Also wants to know if Dr. Laban EmperorNaveira will refer him to someone that can draw fluid off of his knee to make him a little more comfortable. Please let them know

## 2015-12-18 ENCOUNTER — Telehealth: Payer: Self-pay | Admitting: *Deleted

## 2015-12-18 ENCOUNTER — Other Ambulatory Visit (HOSPITAL_COMMUNITY): Payer: Medicare Other

## 2015-12-18 NOTE — Telephone Encounter (Signed)
Referred to orthopedic surgeon. 

## 2015-12-19 ENCOUNTER — Encounter (HOSPITAL_COMMUNITY): Payer: Self-pay | Admitting: Certified Registered Nurse Anesthetist

## 2015-12-19 ENCOUNTER — Ambulatory Visit (INDEPENDENT_AMBULATORY_CARE_PROVIDER_SITE_OTHER): Payer: Medicare Other | Admitting: Urology

## 2015-12-19 ENCOUNTER — Encounter: Payer: Self-pay | Admitting: Urology

## 2015-12-19 VITALS — BP 133/81 | HR 80 | Ht 71.0 in | Wt 182.4 lb

## 2015-12-19 DIAGNOSIS — N430 Encysted hydrocele: Secondary | ICD-10-CM

## 2015-12-19 NOTE — Progress Notes (Signed)
12/19/2015 9:46 AM   Louis Wells 11/15/1932 [id number]  Referring provider: Delano MetzFrancisco Naveira, MD [address] [place], [place] [address]  Chief Complaint  Patient presents with  . New Patient (Initial Visit)    testicular swelling, hydrocele     HPI: The patient is an 82 year old gentleman presents for evaluation of fluid around his right testicle.  He has been seeing Dr. 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 He feels that if the fluid was taken off his right testicle and he may be able to urinate better. It is not painful. He has no sign of infection. He is on eliquis.   PMH: Past Medical History:  Diagnosis Date  . Arthralgia of ankle or foot 11/20/2014  . Atrial fibrillation (HCC)   . Degenerative arthritis of hip 04/05/2015  . Hyperlipidemia   . Hypertension   . Myocardial infarction   . Primary osteoarthritis of right knee 05/22/2015  . Thyroid disease     Surgical History: Past Surgical History:  Procedure Laterality Date  . CORONARY ANGIOPLASTY WITH STENT PLACEMENT      Home Medications:    Medication List       Accurate as of 12/19/15  9:46 AM. Always use your most recent med list.          acetaminophen 325 MG tablet Commonly known as:  TYLENOL Take 650 mg by mouth 2 (two) times daily.   apixaban 5 MG Tabs tablet Commonly known as:  ELIQUIS Take 5 mg by mouth 2 (two) times daily.   BENEFIBER Powd Stir 2 tsp. TID into 4-8 oz of any non-carbonated beverage or soft food (hot or cold)   famotidine 20 MG tablet Commonly known as:  PEPCID Take 20 mg by mouth 2 (two) times daily.   levothyroxine 112 MCG tablet Commonly known as:  SYNTHROID, LEVOTHROID 112 mcg daily before breakfast.   lisinopril 20 MG tablet Commonly known as:  PRINIVIL,ZESTRIL Take 20 mg by mouth daily.   lubiprostone 8 MCG capsule Commonly known as:   AMITIZA Take 1 capsule (8 mcg total) by mouth 2 (two) times daily with a meal. Swallow the medication whole. Do not break or chew the medication.   metoprolol 100 MG tablet Commonly known as:  LOPRESSOR Take 100 mg by mouth daily.   metoprolol succinate 100 MG 24 hr tablet Commonly known as:  TOPROL-XL   nitroGLYCERIN 0.4 MG SL tablet Commonly known as:  NITROSTAT Place under the tongue.   oxyCODONE 5 MG immediate release tablet Commonly known as:  Oxy IR/ROXICODONE Take 1 tablet (5 mg total) by mouth every 6 (six) hours as needed for severe pain.   VITAMIN B 12 PO Take 500 mg by mouth daily.       Allergies:  Allergies  Allergen Reactions  . Contrast Media [Iodinated Diagnostic Agents] Shortness Of Breath  . Amoxicillin   . Sulfa Antibiotics     Other reaction(s): Unknown Uncoded Allergy. Allergen: IV contrast    Family History: Family History  Problem Relation Age of Onset  . Cancer Mother   . Diabetes Father     Social History:  reports that he has quit smoking. He has never used smokeless tobacco. He reports that he does not drink alcohol or use drugs.  ROS: UROLOGY Frequent Urination?: No Hard to postpone urination?: No Burning/pain with urination?: No Get  up at night to urinate?: Yes Leakage of urine?: No Urine stream starts and stops?: No Trouble starting stream?: No Do you have to strain to urinate?: Yes Blood in urine?: No Urinary tract infection?: No Sexually transmitted disease?: No Injury to kidneys or bladder?: No Painful intercourse?: No Weak stream?: No Erection problems?: No Penile pain?: No  Gastrointestinal Nausea?: No Vomiting?: No Indigestion/heartburn?: No Diarrhea?: No Constipation?: Yes  Constitutional Fever: No Night sweats?: No Weight loss?: No Fatigue?: No  Skin Skin rash/lesions?: No Itching?: Yes  Eyes Blurred vision?: No Double vision?: No  Ears/Nose/Throat Sore throat?: No Sinus problems?:  No  Hematologic/Lymphatic Swollen glands?: No Easy bruising?: Yes  Cardiovascular Leg swelling?: Yes Chest pain?: No  Respiratory Cough?: No Shortness of breath?: No  Endocrine Excessive thirst?: No  Musculoskeletal Back pain?: No Joint pain?: Yes  Neurological Headaches?: No Dizziness?: No  Psychologic Depression?: No Anxiety?: No  Physical Exam: BP 133/81   Pulse 80   Ht 5\' 11"  (1.803 m)   Wt 182 lb 6.4 oz (82.7 kg)   BMI 25.44 kg/m   Constitutional:  Alert and oriented, No acute distress. HEENT: [place] AT, moist mucus membranes.  Trachea midline, no masses. Cardiovascular: No clubbing, cyanosis, or edema. Respiratory: Normal respiratory effort, no increased work of breathing. GI: Abdomen is soft, nontender, nondistended, no abdominal masses GU: No CVA tenderness. Patient has a buried penis. Normal left testicle. He has a very small hydrocele that is not completely distended or taut on exam. It does not appear to be contributing to his buried penis as it is very small in size. Skin: No rashes, bruises or suspicious lesions. Lymph: No cervical or inguinal adenopathy. Neurologic: Grossly intact, no focal deficits, moving all 4 extremities. Psychiatric: Normal mood and affect.  Laboratory Data: No results found for: WBC, HGB, HCT, MCV, PLT  Lab Results  Component Value Date   CREATININE 1.45 (H) 07/03/2015    No results found for: PSA  No results found for: TESTOSTERONE  No results found for: HGBA1C  Urinalysis No results found for: COLORURINE, APPEARANCEUR, LABSPEC, PHURINE, GLUCOSEU, HGBUR, BILIRUBINUR, KETONESUR, PROTEINUR, UROBILINOGEN, NITRITE, LEUKOCYTESUR   Assessment & Plan:    1. Right hydrocele I had a long talk with the patient regarding his very small hydrocele and his buried penis. I do not think draining this very small hydrocele with increase the size his penis significantly which is his main complaint. I also discussed that it is  generally not recommended to drain hydroceles as they ultimately will return and this puts it at risk of becoming infected which is hard to treat. Also ay this time, his hydrocele is minimal, and I feel there is risk to damaging either the testicle and spermatic cord in attempting to drain the hydrocele. He is also an eliquis which puts him at even greater risk for hematoma at this time. We discussed thoroughly that the risks outweigh the benefits of performing this procedure and it will not likely help with his main complaint which is his buried penis. All questions were answered. The patient will follow-up with us as needed.  Return if symptoms worsen or fail to improve.  Hildred LaserBrian James Alani Sabbagh, MD  [facility] [address], [address] [place], [place] [address] [phone]

## 2015-12-20 ENCOUNTER — Ambulatory Visit: Admit: 2015-12-20 | Payer: Medicare Other

## 2015-12-20 ENCOUNTER — Ambulatory Visit (HOSPITAL_COMMUNITY): Payer: Medicare Other

## 2015-12-20 SURGERY — RADIOLOGY WITH ANESTHESIA
Anesthesia: General | Laterality: Right

## 2015-12-27 ENCOUNTER — Other Ambulatory Visit: Payer: Self-pay | Admitting: Orthopedic Surgery

## 2015-12-27 DIAGNOSIS — M1711 Unilateral primary osteoarthritis, right knee: Secondary | ICD-10-CM

## 2015-12-27 DIAGNOSIS — M7121 Synovial cyst of popliteal space [Baker], right knee: Secondary | ICD-10-CM

## 2015-12-31 ENCOUNTER — Ambulatory Visit
Admission: RE | Admit: 2015-12-31 | Discharge: 2015-12-31 | Disposition: A | Discharge status: Home / Self Care | Payer: Medicare Other | Source: Ambulatory Visit | Attending: Orthopedic Surgery | Admitting: Orthopedic Surgery

## 2015-12-31 ENCOUNTER — Ambulatory Visit: Admission: RE | Admit: 2015-12-31 | Payer: Medicare Other | Source: Ambulatory Visit

## 2015-12-31 DIAGNOSIS — M7121 Synovial cyst of popliteal space [Baker], right knee: Secondary | ICD-10-CM | POA: Diagnosis not present

## 2015-12-31 DIAGNOSIS — M1711 Unilateral primary osteoarthritis, right knee: Secondary | ICD-10-CM

## 2015-12-31 MED ORDER — BUPIVACAINE HCL (PF) 0.25 % IJ SOLN
INTRAMUSCULAR | Status: AC
  Filled 2015-12-31: qty 30

## 2015-12-31 MED ORDER — METHYLPREDNISOLONE ACETATE 80 MG/ML IJ SUSP
INTRAMUSCULAR | Status: AC
  Filled 2015-12-31: qty 2

## 2015-12-31 NOTE — Procedures (Signed)
Technically successful US guided aspiration and steroid injection of complex Baker's cyst. Approximately 70 cc of dark red / brown fluid was aspirated from the complex right sided Baker's cyst. EBL: None   No immediate complications.   Katherina RightJay Cheral Cappucci, MD Pager #: [phone]

## 2016-02-13 ENCOUNTER — Encounter: Payer: Medicare Other | Admitting: Pain Medicine

## 2016-02-14 ENCOUNTER — Ambulatory Visit: Payer: Medicare Other | Admitting: Pain Medicine

## 2016-02-18 ENCOUNTER — Ambulatory Visit: Payer: Medicare Other | Attending: Pain Medicine | Admitting: Pain Medicine

## 2016-02-18 ENCOUNTER — Encounter: Payer: Self-pay | Admitting: Pain Medicine

## 2016-02-18 VITALS — BP 100/60 | HR 68 | Temp 98.2°F | Resp 16 | Ht 72.0 in | Wt 182.0 lb

## 2016-02-18 DIAGNOSIS — I4891 Unspecified atrial fibrillation: Secondary | ICD-10-CM | POA: Insufficient documentation

## 2016-02-18 DIAGNOSIS — I252 Old myocardial infarction: Secondary | ICD-10-CM | POA: Diagnosis not present

## 2016-02-18 DIAGNOSIS — Z79899 Other long term (current) drug therapy: Secondary | ICD-10-CM | POA: Insufficient documentation

## 2016-02-18 DIAGNOSIS — M1611 Unilateral primary osteoarthritis, right hip: Secondary | ICD-10-CM

## 2016-02-18 DIAGNOSIS — Z91041 Radiographic dye allergy status: Secondary | ICD-10-CM | POA: Insufficient documentation

## 2016-02-18 DIAGNOSIS — Z955 Presence of coronary angioplasty implant and graft: Secondary | ICD-10-CM | POA: Insufficient documentation

## 2016-02-18 DIAGNOSIS — Z880 Allergy status to penicillin: Secondary | ICD-10-CM | POA: Diagnosis not present

## 2016-02-18 DIAGNOSIS — Z5181 Encounter for therapeutic drug level monitoring: Secondary | ICD-10-CM | POA: Insufficient documentation

## 2016-02-18 DIAGNOSIS — Z87891 Personal history of nicotine dependence: Secondary | ICD-10-CM | POA: Insufficient documentation

## 2016-02-18 DIAGNOSIS — E039 Hypothyroidism, unspecified: Secondary | ICD-10-CM | POA: Diagnosis not present

## 2016-02-18 DIAGNOSIS — M87051 Idiopathic aseptic necrosis of right femur: Secondary | ICD-10-CM | POA: Diagnosis not present

## 2016-02-18 DIAGNOSIS — I2510 Atherosclerotic heart disease of native coronary artery without angina pectoris: Secondary | ICD-10-CM | POA: Insufficient documentation

## 2016-02-18 DIAGNOSIS — Z79891 Long term (current) use of opiate analgesic: Secondary | ICD-10-CM | POA: Insufficient documentation

## 2016-02-18 DIAGNOSIS — Z7901 Long term (current) use of anticoagulants: Secondary | ICD-10-CM | POA: Diagnosis not present

## 2016-02-18 DIAGNOSIS — M1711 Unilateral primary osteoarthritis, right knee: Secondary | ICD-10-CM | POA: Diagnosis not present

## 2016-02-18 DIAGNOSIS — G894 Chronic pain syndrome: Secondary | ICD-10-CM | POA: Diagnosis not present

## 2016-02-18 DIAGNOSIS — F1190 Opioid use, unspecified, uncomplicated: Secondary | ICD-10-CM | POA: Diagnosis not present

## 2016-02-18 DIAGNOSIS — E785 Hyperlipidemia, unspecified: Secondary | ICD-10-CM | POA: Insufficient documentation

## 2016-02-18 DIAGNOSIS — I129 Hypertensive chronic kidney disease with stage 1 through stage 4 chronic kidney disease, or unspecified chronic kidney disease: Secondary | ICD-10-CM | POA: Insufficient documentation

## 2016-02-18 DIAGNOSIS — N183 Chronic kidney disease, stage 3 (moderate): Secondary | ICD-10-CM | POA: Diagnosis not present

## 2016-02-18 DIAGNOSIS — M25572 Pain in left ankle and joints of left foot: Secondary | ICD-10-CM | POA: Diagnosis not present

## 2016-02-18 MED ORDER — OXYCODONE HCL 5 MG PO TABS: 5.0000 mg | ORAL_TABLET | Freq: Four times a day (QID) | ORAL | 0 refills | Status: DC | PRN

## 2016-02-18 NOTE — Progress Notes (Signed)
Nursing Pain Medication Assessment:  Safety precautions to be maintained throughout the outpatient stay will include: orient to surroundings, keep bed in low position, maintain call bell within reach at all times, provide assistance with transfer out of bed and ambulation.  Medication Inspection Compliance: Pill count conducted under aseptic conditions, in front of the patient. Neither the pills nor the bottle was removed from the patient's sight at any time. Once count was completed pills were immediately returned to the patient in their original bottle.  Medication: Oxycodone IR Pill Count: 46 of 120 pills remain Bottle Appearance: Standard pharmacy container. Clearly labeled. Filled Date: 12 /14 / 2017 Medication last intake: 02-18-16 at 1240

## 2016-02-18 NOTE — Progress Notes (Signed)
Patient's Name: Louis Wells  MRN: [id number]  Referring Provider: Milinda Pointer, MD  DOB: 18-May-1932  PCP: Tracie Harrier, MD  DOS: 02/18/2016  Note by: Kathlen Brunswick. Dossie Arbour, MD  Service setting: Ambulatory outpatient  Specialty: Interventional Pain Management  Location: [facility] (AMB) Pain Management Facility    Patient type: Established   Primary Reason(s) for Visit: Encounter for prescription drug management (Level of risk: moderate) CC: Knee Pain (right); Hip Pain (right); and Ankle Pain (left)  HPI  Louis Wells is a 83 y.o. year old, male patient, who comes today for a medication management evaluation. He has A-fib (Louis Wells); Acute myocardial infarction of anterior wall (Louis Wells); Arteriosclerosis of coronary artery; Chronic kidney disease, stage III (moderate); Long term current use of opiate analgesic; History of cardiac catheterization; BP (high blood pressure); Hyperlipidemia; Hypothyroidism; Long term prescription opiate use; Opiate use (10 MME/Day); Encounter for therapeutic drug level monitoring; Disturbance of skin sensation; Pain Hip (Right); Pain in ankle; Chronic knee pain (Location of Secondary source of pain) (Bilateral) (R>L); Osteoarthritis of hip (Location of Primary Source of Pain) (Right); Osteoarthritis of ankle (Location of Tertiary source of pain) (Bilateral) (L>R); Osteoarthritis of knee (Location of Secondary source of pain) (Right); Avascular necrosis of femur head (HCC) (Right); Fluid in knee joint (Right); Encounter for chronic pain management; 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 His primarily concern today is the Knee Pain (right); Hip Pain (right); and Ankle Pain (left)  Pain Assessment: Self-Reported Pain Score: 4 /10             Reported level is compatible with observation.       Pain Type: Chronic  pain Pain Location: Hip Pain Orientation: Right Pain Descriptors / Indicators: Aching  Louis Wells was last seen on 12/10/2015 for medication management. During today's appointment we reviewed Louis Wells chronic pain status, as well as his outpatient medication regimen. The patient returns today indicating that he had 70 cc of blood drawn by the orthopedic surgeon. This was followed by a steroid injection that seems to have helped. This steroid injection has been repeated already twice but there has been no recurrence of the bleeding. They described the blood as not coagulating, just as in the first treatment that he received in our clinic. Because the patient is anticoagulated with Eliquis there is always the possibility that he can have another hemarthrosis. However, at this point he seems to have improved and for that we have the orthopedic surgeon to thank for. I will continue to manage his medications, but in view of his increased risk of bleeding, I will try to limited his procedures as much as possible. However, an alternative treatment for this knee pain would be to consider genicular nerve blocks and possibly radiofrequency.  The patient  reports that he does not use drugs. His body mass index is 24.68 kg/m.  Further details on both, my assessment(s), as well as the proposed treatment plan, please see below.  Controlled Substance Pharmacotherapy Assessment REMS (Risk Evaluation and Mitigation Strategy)  Analgesic: Oxycodone IR 5 mg 1 tablet every 6 hours when necessary for pain (20 mg per day of oxycodone) MME/day: 30 mg/day.  Landis Martins, RN  02/18/2016  2:27 PM  Sign at close encounter Nursing Pain Medication Assessment:  Safety precautions to be maintained throughout the outpatient stay will include: orient to surroundings, keep  bed in low position, maintain call bell within reach at all times, provide assistance with transfer out of bed and ambulation.  Medication Inspection  Compliance: Pill count conducted under aseptic conditions, in front of the patient. Neither the pills nor the bottle was removed from the patient's sight at any time. Once count was completed pills were immediately returned to the patient in their original bottle.  Medication: Oxycodone IR Pill Count: 46 of 120 pills remain Bottle Appearance: Standard pharmacy container. Clearly labeled. Filled Date: 12 /14 / 2017 Medication last intake: 02-18-16 at 1240   Pharmacokinetics: Liberation and absorption (onset of action): WNL Distribution (time to peak effect): WNL Metabolism and excretion (duration of action): WNL         Pharmacodynamics: Desired effects: Analgesia: Louis Wells reports >50% benefit. Functional ability: Patient reports that medication allows him to accomplish basic ADLs Clinically meaningful improvement in function (CMIF): Sustained CMIF goals met Perceived effectiveness: Described as relatively effective, allowing for increase in activities of daily living (ADL) Undesirable effects: Side-effects or Adverse reactions: None reported Monitoring: [place] PMP: Online review of the past 48-monthperiod conducted. Compliant with practice rules and regulations List of all UDS test(s) done:  Lab Results  Component Value Date   TOXASSSELUR FINAL 07/03/2015   Last UDS on record: ToxAssure Select 13  Date Value Ref Range Status  07/03/2015 FINAL  Final    Comment:    ==================================================================== TOXASSURE SELECT 13 (MW) ==================================================================== Test                             Result       Flag       Units Drug Present and Declared for Prescription Verification   Hydrocodone                    432          EXPECTED   ng/mg creat   Dihydrocodeine                 65           EXPECTED   ng/mg creat   Norhydrocodone                 537          EXPECTED   ng/mg creat    Sources of hydrocodone include  scheduled prescription    medications. Dihydrocodeine and norhydrocodone are expected    metabolites of hydrocodone. Dihydrocodeine is also available as a    scheduled prescription medication. ==================================================================== Test                      Result    Flag   Units      Ref Range   Creatinine              171              mg/dL      >=20 ==================================================================== Declared Medications:  The flagging and interpretation on this report are based on the  following declared medications.  Unexpected results may arise from  inaccuracies in the declared medications.  **Note: The testing scope of this panel includes these medications:  Hydrocodone (Hydrocodone-Acetaminophen)  **Note: The testing scope of this panel does not include following  reported medications:  Acetaminophen (Hydrocodone-Acetaminophen)  Apixaban (Eliquis)  Cyanocobalamin  Diclofenac (Voltaren)  Famotidine (Pepcid)  Levothyroxine  Lisinopril  Meloxicam (Mobic)  Metoprolol  Nitroglycerin ==================================================================== For clinical consultation, please call [phone]. ====================================================================    UDS interpretation: Compliant          Medication Assessment Form: Reviewed. Patient indicates being compliant with therapy Treatment compliance: Compliant Risk Assessment Profile: Aberrant behavior: See prior evaluations. None observed or detected today Comorbid factors increasing risk of overdose: See prior notes. No additional risks detected today Risk of substance use disorder (SUD): Low Opioid Risk Tool (ORT) Total Score: 0  Interpretation Table:  Score <3 = Low Risk for SUD  Score between 4-7 = Moderate Risk for SUD  Score >8 = High Risk for Opioid Abuse   Risk Mitigation Strategies:  Patient Counseling: Covered Patient-Prescriber Agreement  (PPA): Present and active  Notification to other healthcare providers: Done  Pharmacologic Plan: No change in therapy, at this time  Laboratory Chemistry  Inflammation Markers Lab Results  Component Value Date   ESRSEDRATE 26 (H) 07/03/2015   CRP 0.7 07/03/2015   Renal Function Lab Results  Component Value Date   BUN 32 (H) 07/03/2015   CREATININE 1.45 (H) 07/03/2015   GFRAA 51 (L) 07/03/2015   GFRNONAA 44 (L) 07/03/2015   Hepatic Function Lab Results  Component Value Date   AST 22 07/03/2015   ALT 11 (L) 07/03/2015   ALBUMIN 4.2 07/03/2015   Electrolytes Lab Results  Component Value Date   NA 137 07/03/2015   K 5.3 (H) 07/03/2015   CL 106 07/03/2015   CALCIUM 9.0 07/03/2015   MG 2.1 07/03/2015   Pain Modulating Vitamins Lab Results  Component Value Date   25OHVITD1 35 07/03/2015   25OHVITD2 <1.0 07/03/2015   25OHVITD3 34 07/03/2015   VITAMINB12 806 07/03/2015   Coagulation Parameters No results found for: INR, LABPROT, APTT, PLT Cardiovascular No results found for: BNP, HGB, HCT Note: Lab results reviewed.  Recent Diagnostic Imaging Review  US Aspiration  Result Date: 12/31/2015 INDICATION: Symptomatic right-sided Baker's cyst. Please perform ultrasound-guided aspiration and steroid injection for symptomatic purposes. EXAM: US ASPIRATION COMPARISON:  Right knee radiographs - 11/07/2015 MEDICATIONS: 120 mg Depo-Medrol diluted in 5 ml of 0.5% bupivacaine ANESTHESIA/SEDATION: None CONTRAST:  None COMPLICATIONS: None immediate. PROCEDURE: Informed written consent was obtained from the patient after a discussion of the risks, benefits and alternatives to treatment. The patient was placed supine with right lower extremity placing external rotation (patient was unable to tolerate lying prone). Preprocedural imaging demonstrating grossly unchanged appearance of the moderate to large size complex Baker cyst within the soft tissues about the posterior medial aspect of the  right knee measuring approximately 6.8 x 3.4 x 8.3 cm (images 9 and 11). The Baker cyst was noted to be markedly complex with multiple internal echogenic septations. The skin overlying the medial posterior aspect of the knee was prepped and draped in usual sterile fashion. The overlying soft tissues were anesthetized with 1% lidocaine. A 19 gauge, 7-cm, Yueh catheter was introduced into the dominant cystic component of the Baker's cyst. Multiple ultrasound images were saved for procedural documentation purposes. Next, approximately 70 cc of dark red / brown non foul smelling fluid was aspirated. At this time, a combination of 120 mg Depo-Medrol diluted in 5 ml of 0.5% bupivacaine was administered into the Baker's cyst. The needle was removed and superficial hemostasis was achieved with manual compression. Postprocedural imaging demonstrated significant reduction in the complex right-sided Baker cyst (representative images 14 and 15). A dressing was placed. The patient tolerated the procedure well without immediate postprocedural complication. IMPRESSION: Successful  ultrasound-guided steroid injection and aspiration approximately 70 cc of dark red / brown non foul smelling fluid tinged fluid of large dominant cystic component of large complex right-sided Baker cyst. Electronically Signed   By: Sandi Mariscal M.D.   On: 12/31/2015 15:23   Note: Imaging results reviewed.          Meds  The patient has a current medication list which includes the following prescription(s): acetaminophen, apixaban, cyanocobalamin, famotidine, levothyroxine, lisinopril, metoprolol, nitroglycerin, oxycodone, oxycodone, oxycodone, and benefiber.  Current Outpatient Prescriptions on File Prior to Visit  Medication Sig  . acetaminophen (TYLENOL) 325 MG tablet Take 650 mg by mouth 2 (two) times daily.  [name] apixaban (ELIQUIS) 5 MG TABS tablet Take 5 mg by mouth 2 (two) times daily.   . Cyanocobalamin (VITAMIN B 12 PO) Take 500 mg by mouth  daily.  . famotidine (PEPCID) 20 MG tablet Take 20 mg by mouth 2 (two) times daily.  [name] levothyroxine (SYNTHROID, LEVOTHROID) 112 MCG tablet 112 mcg daily before breakfast.   . lisinopril (PRINIVIL,ZESTRIL) 20 MG tablet Take 20 mg by mouth daily.  . metoprolol (LOPRESSOR) 100 MG tablet Take 100 mg by mouth daily.  . nitroGLYCERIN (NITROSTAT) 0.4 MG SL tablet Place under the tongue.  . Wheat Dextrin (BENEFIBER) POWD Stir 2 tsp. TID into 4-8 oz of any non-carbonated beverage or soft food (hot or cold)   No current facility-administered medications on file prior to visit.    ROS  Constitutional: Denies any fever or chills Gastrointestinal: No reported hemesis, hematochezia, vomiting, or acute GI distress Musculoskeletal: Denies any acute onset joint swelling, redness, loss of ROM, or weakness Neurological: No reported episodes of acute onset apraxia, aphasia, dysarthria, agnosia, amnesia, paralysis, loss of coordination, or loss of consciousness  Allergies  Mr. Vanderloop is allergic to contrast media [iodinated diagnostic agents]; amoxicillin; and sulfa antibiotics.  [place]  Drug: Mr. Gustafson  reports that he does not use drugs. Alcohol:  reports that he does not drink alcohol. Tobacco:  reports that he has quit smoking. He has never used smokeless tobacco. Medical:  has a past medical history of Arthralgia of ankle or foot (11/20/2014); Atrial fibrillation ([place]); Degenerative arthritis of hip (04/05/2015); Hyperlipidemia; Hypertension; Myocardial infarction; Primary osteoarthritis of right knee (05/22/2015); and Thyroid disease. Family: family history includes Cancer in his mother; Diabetes in his father.  Past Surgical History:  Procedure Laterality Date  . CORONARY ANGIOPLASTY WITH STENT PLACEMENT     Constitutional Exam  General appearance: Well nourished, well developed, and well hydrated. In no apparent acute distress Vitals:   02/18/16 1416  BP: 100/60  Pulse: 68  Resp: 16  Temp:  98.2 F (36.8 C)  TempSrc: Oral  SpO2: 98%  Weight: 182 lb (82.6 kg)  Height: 6' (1.829 m)   BMI Assessment: Estimated body mass index is 24.68 kg/m as calculated from the following:   Height as of this encounter: 6' (1.829 m).   Weight as of this encounter: 182 lb (82.6 kg).  BMI interpretation table: BMI level Category Range association with higher incidence of chronic pain  <18 kg/m2 Underweight   18.5-24.9 kg/m2 Ideal body weight   25-29.9 kg/m2 Overweight Increased incidence by 20%  30-34.9 kg/m2 Obese (Class I) Increased incidence by 68%  35-39.9 kg/m2 Severe obesity (Class II) Increased incidence by 136%  >40 kg/m2 Extreme obesity (Class III) Increased incidence by 254%   BMI Readings from Last 4 Encounters:  02/18/16 24.68 kg/m  12/31/15 25.38 kg/m  12/19/15 25.44  kg/m  11/20/15 26.18 kg/m   Wt Readings from Last 4 Encounters:  02/18/16 182 lb (82.6 kg)  12/31/15 182 lb (82.6 kg)  12/19/15 182 lb 6.4 oz (82.7 kg)  11/20/15 193 lb (87.5 kg)  Psych/Mental status: Alert, oriented x 3 (person, place, & time) Eyes: PERLA Respiratory: No evidence of acute respiratory distress  Cervical Spine Exam  Inspection: No masses, redness, or swelling Alignment: Symmetrical Functional ROM: Unrestricted ROM Stability: No instability detected Muscle strength & Tone: Functionally intact Sensory: Unimpaired Palpation: Non-contributory  Upper Extremity (UE) Exam    Side: Right upper extremity  Side: Left upper extremity  Inspection: No masses, redness, swelling, or asymmetry  Inspection: No masses, redness, swelling, or asymmetry  Functional ROM: Unrestricted ROM          Functional ROM: Unrestricted ROM          Muscle strength & Tone: Functionally intact  Muscle strength & Tone: Functionally intact  Sensory: Unimpaired  Sensory: Unimpaired  Palpation: Non-contributory  Palpation: Non-contributory   Thoracic Spine Exam  Inspection: No masses, redness, or  swelling Alignment: Symmetrical Functional ROM: Unrestricted ROM Stability: No instability detected Sensory: Unimpaired Muscle strength & Tone: Functionally intact Palpation: Non-contributory  Lumbar Spine Exam  Inspection: No masses, redness, or swelling Alignment: Symmetrical Functional ROM: Unrestricted ROM Stability: No instability detected Muscle strength & Tone: Functionally intact Sensory: Unimpaired Palpation: Non-contributory Provocative Tests: Lumbar Hyperextension and rotation test: evaluation deferred today       Patrick's Maneuver: evaluation deferred today              Gait & Posture Assessment  Ambulation: Patient ambulates using a wheel chair Gait: Very limited, using assistive device to ambulate Posture: Antalgic   Lower Extremity Exam    Side: Right lower extremity  Side: Left lower extremity  Inspection: No masses, redness, swelling, or asymmetry  Inspection: No masses, redness, swelling, or asymmetry  Functional ROM: Unrestricted ROM          Functional ROM: Unrestricted ROM          Muscle strength & Tone: Functionally intact  Muscle strength & Tone: Functionally intact  Sensory: Unimpaired  Sensory: Unimpaired  Palpation: Non-contributory  Palpation: Non-contributory   Assessment  Primary Diagnosis & Pertinent Problem List: The primary encounter diagnosis was Chronic pain syndrome. Diagnoses of Osteoarthritis of hip (Location of Primary Source of Pain) (Right), Osteoarthritis of knee (Location of Secondary source of pain) (Right), Long term current use of opiate analgesic, Opiate use (10 MME/Day), and Avascular necrosis of femur head (HCC) (Right) were also pertinent to this visit.  Status Diagnosis  Stable Stable Pain has improved 1. Chronic pain syndrome   2. Osteoarthritis of hip (Location of Primary Source of Pain) (Right)   3. Osteoarthritis of knee (Location of Secondary source of pain) (Right)   4. Long term current use of opiate analgesic   5.  Opiate use (10 MME/Day)   6. Avascular necrosis of femur head (HCC) (Right)      Plan of Care  Pharmacotherapy (Medications Ordered): Meds ordered this encounter  Medications  . oxyCODONE (OXY IR/ROXICODONE) 5 MG immediate release tablet    Sig: Take 1 tablet (5 mg total) by mouth every 6 (six) hours as needed for severe pain.    Dispense:  120 tablet    Refill:  0    Do not add this medication to the electronic "Automatic Refill" notification system. Patient may have prescription filled one day early if pharmacy is  closed on scheduled refill date. Do not fill until: 02/18/16 To last until: 03/19/16  . oxyCODONE (OXY IR/ROXICODONE) 5 MG immediate release tablet    Sig: Take 1 tablet (5 mg total) by mouth every 6 (six) hours as needed for severe pain.    Dispense:  120 tablet    Refill:  0    Do not add this medication to the electronic "Automatic Refill" notification system. Patient may have prescription filled one day early if pharmacy is closed on scheduled refill date. Do not fill until: 03/19/16 To last until: 04/18/16  . oxyCODONE (OXY IR/ROXICODONE) 5 MG immediate release tablet    Sig: Take 1 tablet (5 mg total) by mouth every 6 (six) hours as needed for severe pain.    Dispense:  120 tablet    Refill:  0    Do not add this medication to the electronic "Automatic Refill" notification system. Patient may have prescription filled one day early if pharmacy is closed on scheduled refill date. Do not fill until: 04/18/16 To last until: 05/18/16   New Prescriptions   OXYCODONE (OXY IR/ROXICODONE) 5 MG IMMEDIATE RELEASE TABLET    Take 1 tablet (5 mg total) by mouth every 6 (six) hours as needed for severe pain.   OXYCODONE (OXY IR/ROXICODONE) 5 MG IMMEDIATE RELEASE TABLET    Take 1 tablet (5 mg total) by mouth every 6 (six) hours as needed for severe pain.   Medications administered today: Mr. Spivak had no medications administered during this visit. Lab-work, procedure(s),  and/or referral(s): Orders Placed This Encounter  Procedures  . ToxASSURE Select 13 (MW), Urine   Imaging and/or referral(s): None  Interventional therapies: Planned, scheduled, and/or pending:   None at this time.    Considering:   Diagnostic genicular nerve block. Possible genicular nerve radiofrequency ablation.    Palliative PRN treatment(s):   Not at this time.   Provider-requested follow-up: Return in about 3 months (around 05/18/2016) for (NP) Med-Mgmt.  No future appointments. Primary Care Physician: Tracie Harrier, MD Location: [facility] Note by: Kathlen Brunswick. Dossie Arbour, M.D, DABA, DABAPM, DABPM, DABIPP, FIPP Date: 02/18/16; Time: 4:38 PM  Pain Score Disclaimer: We use the NRS-11 scale. This is a self-reported, subjective measurement of pain severity with only modest accuracy. It is used primarily to identify changes within a particular patient. It must be understood that outpatient pain scales are significantly less accurate that those used for research, where they can be applied under ideal controlled circumstances with minimal exposure to variables. In reality, the score is likely to be a combination of pain intensity and pain affect, where pain affect describes the degree of emotional arousal or changes in action readiness caused by the sensory experience of pain. Factors such as social and work situation, setting, emotional state, anxiety levels, expectation, and prior pain experience may influence pain perception and show large inter-individual differences that may also be affected by time variables.  Patient instructions provided during this appointment: There are no Patient Instructions on file for this visit.

## 2016-02-25 LAB — TOXASSURE SELECT 13 (MW), URINE

## 2016-04-21 NOTE — Addendum Note (Signed)
Encounter addended by: Baylen Buckner S Maymie Brunke, RT on: 04/21/2016 10:43 AM<BR>    Actions taken: Imaging Exam ended, Charge Capture section accepted

## 2016-05-08 ENCOUNTER — Ambulatory Visit: Payer: Medicare Other | Admitting: Pain Medicine

## 2016-08-19 NOTE — Telephone Encounter (Signed)
NA

## 2020-10-08 ENCOUNTER — Inpatient Hospital Stay
Admission: EM | Admit: 2020-10-08 | Discharge: 2020-10-10 | DRG: 640 | Disposition: A | Discharge status: Home / Self Care | Payer: Medicare Other | Attending: Family Medicine | Admitting: Family Medicine

## 2020-10-08 ENCOUNTER — Emergency Department: Payer: Medicare Other

## 2020-10-08 ENCOUNTER — Other Ambulatory Visit: Payer: Self-pay

## 2020-10-08 DIAGNOSIS — Z79890 Hormone replacement therapy (postmenopausal): Secondary | ICD-10-CM

## 2020-10-08 DIAGNOSIS — I2510 Atherosclerotic heart disease of native coronary artery without angina pectoris: Secondary | ICD-10-CM | POA: Diagnosis present

## 2020-10-08 DIAGNOSIS — Z20822 Contact with and (suspected) exposure to covid-19: Secondary | ICD-10-CM | POA: Diagnosis present

## 2020-10-08 DIAGNOSIS — I482 Chronic atrial fibrillation, unspecified: Secondary | ICD-10-CM | POA: Diagnosis present

## 2020-10-08 DIAGNOSIS — Z881 Allergy status to other antibiotic agents status: Secondary | ICD-10-CM

## 2020-10-08 DIAGNOSIS — Z882 Allergy status to sulfonamides status: Secondary | ICD-10-CM

## 2020-10-08 DIAGNOSIS — E860 Dehydration: Secondary | ICD-10-CM | POA: Diagnosis not present

## 2020-10-08 DIAGNOSIS — Z91041 Radiographic dye allergy status: Secondary | ICD-10-CM

## 2020-10-08 DIAGNOSIS — I959 Hypotension, unspecified: Secondary | ICD-10-CM | POA: Diagnosis present

## 2020-10-08 DIAGNOSIS — I252 Old myocardial infarction: Secondary | ICD-10-CM

## 2020-10-08 DIAGNOSIS — E039 Hypothyroidism, unspecified: Secondary | ICD-10-CM | POA: Diagnosis present

## 2020-10-08 DIAGNOSIS — E785 Hyperlipidemia, unspecified: Secondary | ICD-10-CM | POA: Diagnosis present

## 2020-10-08 DIAGNOSIS — T501X5A Adverse effect of loop [high-ceiling] diuretics, initial encounter: Secondary | ICD-10-CM | POA: Diagnosis present

## 2020-10-08 DIAGNOSIS — Z87891 Personal history of nicotine dependence: Secondary | ICD-10-CM

## 2020-10-08 DIAGNOSIS — G9341 Metabolic encephalopathy: Secondary | ICD-10-CM | POA: Diagnosis present

## 2020-10-08 DIAGNOSIS — E871 Hypo-osmolality and hyponatremia: Secondary | ICD-10-CM | POA: Diagnosis present

## 2020-10-08 DIAGNOSIS — D631 Anemia in chronic kidney disease: Secondary | ICD-10-CM | POA: Diagnosis present

## 2020-10-08 DIAGNOSIS — Z955 Presence of coronary angioplasty implant and graft: Secondary | ICD-10-CM

## 2020-10-08 DIAGNOSIS — R4182 Altered mental status, unspecified: Secondary | ICD-10-CM

## 2020-10-08 DIAGNOSIS — E872 Acidosis: Secondary | ICD-10-CM | POA: Diagnosis present

## 2020-10-08 DIAGNOSIS — Z79899 Other long term (current) drug therapy: Secondary | ICD-10-CM

## 2020-10-08 DIAGNOSIS — I129 Hypertensive chronic kidney disease with stage 1 through stage 4 chronic kidney disease, or unspecified chronic kidney disease: Secondary | ICD-10-CM | POA: Diagnosis present

## 2020-10-08 DIAGNOSIS — G894 Chronic pain syndrome: Secondary | ICD-10-CM | POA: Diagnosis present

## 2020-10-08 DIAGNOSIS — Z7901 Long term (current) use of anticoagulants: Secondary | ICD-10-CM

## 2020-10-08 DIAGNOSIS — E878 Other disorders of electrolyte and fluid balance, not elsewhere classified: Secondary | ICD-10-CM | POA: Diagnosis present

## 2020-10-08 DIAGNOSIS — N179 Acute kidney failure, unspecified: Secondary | ICD-10-CM | POA: Diagnosis present

## 2020-10-08 DIAGNOSIS — N183 Chronic kidney disease, stage 3 unspecified: Secondary | ICD-10-CM | POA: Diagnosis present

## 2020-10-08 DIAGNOSIS — E861 Hypovolemia: Secondary | ICD-10-CM | POA: Diagnosis present

## 2020-10-08 LAB — COMPREHENSIVE METABOLIC PANEL
ALT: 16 U/L (ref 0–44)
AST: 59 U/L — ABNORMAL HIGH (ref 15–41)
Albumin: 3.4 g/dL — ABNORMAL LOW (ref 3.5–5.0)
Alkaline Phosphatase: 80 U/L (ref 38–126)
Anion gap: 9 (ref 5–15)
BUN: 31 mg/dL — ABNORMAL HIGH (ref 8–23)
CO2: 21 mmol/L — ABNORMAL LOW (ref 22–32)
Calcium: 8.7 mg/dL — ABNORMAL LOW (ref 8.9–10.3)
Chloride: 97 mmol/L — ABNORMAL LOW (ref 98–111)
Creatinine, Ser: 1.2 mg/dL (ref 0.61–1.24)
GFR, Estimated: 59 mL/min — ABNORMAL LOW (ref >60)
Glucose, Bld: 122 mg/dL — ABNORMAL HIGH (ref 70–99)
Potassium: 4.1 mmol/L (ref 3.5–5.1)
Sodium: 127 mmol/L — ABNORMAL LOW (ref 135–145)
Total Bilirubin: 1.5 mg/dL — ABNORMAL HIGH (ref 0.3–1.2)
Total Protein: 5.9 g/dL — ABNORMAL LOW (ref 6.5–8.1)

## 2020-10-08 LAB — APTT: aPTT: 34 seconds (ref 24–36)

## 2020-10-08 LAB — CBC WITH DIFFERENTIAL/PLATELET
Abs Immature Granulocytes: 0.05 10*3/uL (ref 0.00–0.07)
Basophils Absolute: 0 10*3/uL (ref 0.0–0.1)
Basophils Relative: 0 %
Eosinophils Absolute: 0.1 10*3/uL (ref 0.0–0.5)
Eosinophils Relative: 1 %
HCT: 33.8 % — ABNORMAL LOW (ref 39.0–52.0)
Hemoglobin: 12.4 g/dL — ABNORMAL LOW (ref 13.0–17.0)
Immature Granulocytes: 1 %
Lymphocytes Relative: 10 %
Lymphs Abs: 0.7 10*3/uL (ref 0.7–4.0)
MCH: 36.4 pg — ABNORMAL HIGH (ref 26.0–34.0)
MCHC: 36.7 g/dL — ABNORMAL HIGH (ref 30.0–36.0)
MCV: 99.1 fL (ref 80.0–100.0)
Monocytes Absolute: 0.8 10*3/uL (ref 0.1–1.0)
Monocytes Relative: 10 %
Neutro Abs: 6 10*3/uL (ref 1.7–7.7)
Neutrophils Relative %: 78 %
Platelets: 219 10*3/uL (ref 150–400)
RBC: 3.41 MIL/uL — ABNORMAL LOW (ref 4.22–5.81)
RDW: 11.9 % (ref 11.5–15.5)
WBC: 7.7 10*3/uL (ref 4.0–10.5)
nRBC: 0 % (ref 0.0–0.2)

## 2020-10-08 LAB — RESP PANEL BY RT-PCR (FLU A&B, COVID) ARPGX2
Influenza A by PCR: NEGATIVE
Influenza B by PCR: NEGATIVE
SARS Coronavirus 2 by RT PCR: NEGATIVE

## 2020-10-08 LAB — PROTIME-INR
INR: 1.9 — ABNORMAL HIGH (ref 0.8–1.2)
Prothrombin Time: 21.8 seconds — ABNORMAL HIGH (ref 11.4–15.2)

## 2020-10-08 LAB — LACTIC ACID, PLASMA: Lactic Acid, Venous: 2.6 mmol/L (ref 0.5–1.9)

## 2020-10-08 MED ORDER — VANCOMYCIN HCL IN DEXTROSE 1-5 GM/200ML-% IV SOLN
1000.0000 mg | Freq: Once | INTRAVENOUS | Status: AC
  Administered 2020-10-08: 1000 mg via INTRAVENOUS
  Filled 2020-10-08: qty 200

## 2020-10-08 MED ORDER — SODIUM CHLORIDE 0.9 % IV SOLN
2.0000 g | Freq: Once | INTRAVENOUS | Status: AC
  Administered 2020-10-08: 2 g via INTRAVENOUS
  Filled 2020-10-08: qty 2

## 2020-10-08 MED ORDER — LACTATED RINGERS IV BOLUS
500.0000 mL | Freq: Once | INTRAVENOUS | Status: AC
  Administered 2020-10-08: 500 mL via INTRAVENOUS

## 2020-10-08 NOTE — ED Provider Notes (Signed)
[facility] Emergency Department Provider Note  ____________________________________________   I have reviewed the triage vital signs and the nursing notes.   HISTORY  Chief Complaint Altered Mental Status   History limited by: AMS, history primarily obtained from daughter at bedside  HPI Louis Wells is a 87 y.o. male who presents to the emergency department today because of concerns for altered mental status and weakness.  Daughter states that over the past couple days she has noticed that patient has been more weak.  However she thought it could be he was just more tired.  However today when family came over to check on the patient he simply sat on the toilet for a couple of hours.  He was exceedingly weak and also found to be somewhat confused.  When EMS arrived they found patient to be hypotensive.  Did initiate IV fluids during transport.  Patient himself denies any pain.  Denies any headache, chest pain or shortness of breath.  Daughter did state however that she noticed some bad odor to his urine.   Records reviewed. Per medical record review patient has a history of atrial fibrillation.  Past Medical History:  Diagnosis Date   Arthralgia of ankle or foot 11/20/2014   Atrial fibrillation (HCC)    Degenerative arthritis of hip 04/05/2015   Hyperlipidemia    Hypertension    Myocardial infarction [facility])    Primary osteoarthritis of right knee 05/22/2015   Thyroid disease     Patient Active Problem List   Diagnosis Date Noted   Chronic pain syndrome 02/18/2016   Knee hemarthrosis (Right) 11/20/2015   Baker's cyst of knee (B) (R>L) 11/07/2015   Encounter for chronic pain management 09/06/2015   Opioid-induced constipation (OIC) 09/06/2015   Fluid in knee joint (Right) 08/07/2015   Osteoarthritis of knee (Location of Secondary source of pain) (Right) 07/30/2015   Avascular necrosis of femur head (HCC) (Right) 07/30/2015   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) 07/30/2015   Long term prescription opiate use 07/03/2015   Opiate use (10 MME/Day) 07/03/2015   Encounter for therapeutic drug level monitoring 07/03/2015   Disturbance of skin sensation 07/03/2015   Pain Hip (Right) 07/03/2015   Pain in ankle 07/03/2015   Chronic knee pain (Location of Secondary source of pain) (Bilateral) (R>L) 07/03/2015   Osteoarthritis of hip (Location of Primary Source of Pain) (Right) 07/03/2015   Osteoarthritis of ankle (Location of Tertiary source of pain) (Bilateral) (L>R) 07/03/2015   Pain in right knee 07/03/2015   Chronic kidney disease, stage III (moderate) (HCC) 04/05/2015   Long term current use of opiate analgesic 04/05/2015   Acute myocardial infarction of anterior wall (HCC) 11/14/2013   History of cardiac catheterization 11/14/2013   Hyperlipidemia 11/14/2013   A-fib (HCC) 06/22/2013   Arteriosclerosis of coronary artery 06/22/2013   BP (high blood pressure) 06/22/2013   Hypothyroidism 06/22/2013    Past Surgical History:  Procedure Laterality Date   CORONARY ANGIOPLASTY WITH STENT PLACEMENT      Prior to Admission medications   Medication Sig Start Date End Date Taking? Authorizing Provider  acetaminophen (TYLENOL) 325 MG tablet Take 650 mg by mouth 2 (two) times daily.    [provider]  apixaban (ELIQUIS) 5 MG TABS tablet Take 5 mg by mouth 2 (two) times daily.  10/22/15   [provider]  Cyanocobalamin (VITAMIN B 12 PO) Take 500 mg by mouth daily.    [provider]  famotidine (PEPCID) 20  MG tablet Take 20 mg by mouth 2 (two) times daily.    [provider]  levothyroxine (SYNTHROID, LEVOTHROID) 112 MCG tablet 112 mcg daily before breakfast.  08/18/15   [provider]  lisinopril (PRINIVIL,ZESTRIL) 20 MG tablet Take 20 mg by mouth daily.    [provider]  metoprolol (LOPRESSOR) 100 MG tablet Take 100 mg by mouth daily.    [provider]  nitroGLYCERIN  (NITROSTAT) 0.4 MG SL tablet Place under the tongue.    [provider]  oxyCODONE (OXY IR/ROXICODONE) 5 MG immediate release tablet Take 1 tablet (5 mg total) by mouth every 6 (six) hours as needed for severe pain. 02/18/16 03/19/16  Delano Metz, MD  oxyCODONE (OXY IR/ROXICODONE) 5 MG immediate release tablet Take 1 tablet (5 mg total) by mouth every 6 (six) hours as needed for severe pain. 03/19/16 04/18/16  Delano Metz, MD  oxyCODONE (OXY IR/ROXICODONE) 5 MG immediate release tablet Take 1 tablet (5 mg total) by mouth every 6 (six) hours as needed for severe pain. 04/18/16 05/18/16  Delano Metz, MD    Allergies Contrast media [iodinated diagnostic agents], Amoxicillin, and Sulfa antibiotics  Family History  Problem Relation Age of Onset   Cancer Mother    Diabetes Father     Social History Social History   Tobacco Use   Smoking status: Former   Smokeless tobacco: Never  Substance Use Topics   Alcohol use: No    Alcohol/week: 0.0 standard drinks   Drug use: No    Review of Systems Constitutional: No fever. Generalized weakness. Eyes: No visual changes. ENT: No sore throat. Cardiovascular: Denies chest pain. Respiratory: Denies shortness of breath. Gastrointestinal: No abdominal pain.  No nausea, no vomiting.  No diarrhea.   Genitourinary: Bad odor per daughter.  Musculoskeletal: Negative for back pain. Skin: Negative for rash. Neurological: Altered mental status.   ____________________________________________   PHYSICAL EXAM:  VITAL SIGNS: ED Triage Vitals [10/08/20 2101]  Enc Vitals Group     BP (!) 82/55     Pulse Rate 74     Resp (!) 21     Temp 98.7 F (37.1 C)     Temp Source Oral     SpO2 100 %     Weight      Height      Head Circumference      Peak Flow      Pain Score 0     Pain Loc      Pain Edu?      Excl. in GC?      Constitutional: Awake, alert, not completely oriented.  Eyes: Conjunctivae are normal.  ENT      Head:  Normocephalic and atraumatic.      Nose: No congestion/rhinnorhea.      Mouth/Throat: Mucous membranes are moist.      Neck: No stridor. Hematological/Lymphatic/Immunilogical: No cervical lymphadenopathy. Cardiovascular: Normal rate, regular rhythm.  No murmurs, rubs, or gallops.  Respiratory: Normal respiratory effort without tachypnea nor retractions. Breath sounds are clear and equal bilaterally. No wheezes/rales/rhonchi. Gastrointestinal: Soft and non tender. No rebound. No guarding.  Genitourinary: Deferred Musculoskeletal: Normal range of motion in all extremities. No lower extremity edema. Neurologic:  Awake and alert. Not completely oriented to events. Moving all extremities.  Skin:  Skin is warm, dry and intact. No rash noted. Psychiatric: Mood and affect are normal. Speech and behavior are normal. Patient exhibits appropriate insight and judgment.  ____________________________________________    LABS (pertinent positives/negatives)  COVID negative Lactic acid 2.6 CMP na 127, k 4.1, glu 122, cr 1.20 CBC wbc 7.7, hgb 12.4, plt 219  ____________________________________________   EKG  I, Phineas Semen, attending physician, personally viewed and interpreted this EKG  EKG Time: 2105 Rate: 71 Rhythm: atrial fibrillation Axis: normal Intervals: qtc 454 QRS: narrow ST changes: no st elevation Impression: abnormal ekg ____________________________________________    RADIOLOGY CXR No acute cardiopulmonary process  ____________________________________________   PROCEDURES  Procedures  ____________________________________________   INITIAL IMPRESSION / ASSESSMENT AND PLAN / ED COURSE  Pertinent labs & imaging results that were available during my care of the patient were reviewed by me and considered in my medical decision making (see chart for details).   Patient presents to the emergency department today because of concerns for weakness and altered mental  status.  Initial exam patient was awake and alert but not fully oriented.  He was found to be hypotensive.  IV fluids were continued here in the emergency department.  Blood work does show a slight lactic acidosis.  He did have some concerns for possible infection.  Also could simply consider dehydration causing this lactic acidosis.  However patient was started on broad-spectrum antibiotics.  Chest x-ray without any pneumonia.  Patient's blood pressure did improve with IV fluids.  Awaiting urine at time of signout.   ____________________________________________   FINAL CLINICAL IMPRESSION(S) / ED DIAGNOSES  Final diagnoses:  Altered mental status, unspecified altered mental status type  Hypotension, unspecified hypotension type     Note: This dictation was prepared with Dragon dictation. Any transcriptional errors that result from this process are unintentional     Phineas Semen, MD 10/08/20 [phone]

## 2020-10-08 NOTE — ED Triage Notes (Signed)
Patient found on toilet by daughter, she asked if he needed help he told her no, this went on for about 2 hours and she called EMS she states he was not mentating normally. EMS states upon arrival BP was 60/30 afib on ECG with HR from 50-150. Patient is alert and talking upon arrival.

## 2020-10-09 DIAGNOSIS — I482 Chronic atrial fibrillation, unspecified: Secondary | ICD-10-CM | POA: Diagnosis present

## 2020-10-09 DIAGNOSIS — Z7901 Long term (current) use of anticoagulants: Secondary | ICD-10-CM | POA: Diagnosis not present

## 2020-10-09 DIAGNOSIS — I129 Hypertensive chronic kidney disease with stage 1 through stage 4 chronic kidney disease, or unspecified chronic kidney disease: Secondary | ICD-10-CM | POA: Diagnosis present

## 2020-10-09 DIAGNOSIS — N179 Acute kidney failure, unspecified: Secondary | ICD-10-CM | POA: Diagnosis present

## 2020-10-09 DIAGNOSIS — I959 Hypotension, unspecified: Secondary | ICD-10-CM | POA: Diagnosis present

## 2020-10-09 DIAGNOSIS — I252 Old myocardial infarction: Secondary | ICD-10-CM | POA: Diagnosis not present

## 2020-10-09 DIAGNOSIS — Z91041 Radiographic dye allergy status: Secondary | ICD-10-CM | POA: Diagnosis not present

## 2020-10-09 DIAGNOSIS — E785 Hyperlipidemia, unspecified: Secondary | ICD-10-CM | POA: Diagnosis present

## 2020-10-09 DIAGNOSIS — Z881 Allergy status to other antibiotic agents status: Secondary | ICD-10-CM | POA: Diagnosis not present

## 2020-10-09 DIAGNOSIS — Z87891 Personal history of nicotine dependence: Secondary | ICD-10-CM | POA: Diagnosis not present

## 2020-10-09 DIAGNOSIS — Z20822 Contact with and (suspected) exposure to covid-19: Secondary | ICD-10-CM | POA: Diagnosis present

## 2020-10-09 DIAGNOSIS — E039 Hypothyroidism, unspecified: Secondary | ICD-10-CM | POA: Diagnosis present

## 2020-10-09 DIAGNOSIS — N183 Chronic kidney disease, stage 3 unspecified: Secondary | ICD-10-CM | POA: Diagnosis present

## 2020-10-09 DIAGNOSIS — G9341 Metabolic encephalopathy: Secondary | ICD-10-CM | POA: Diagnosis present

## 2020-10-09 DIAGNOSIS — Z882 Allergy status to sulfonamides status: Secondary | ICD-10-CM | POA: Diagnosis not present

## 2020-10-09 DIAGNOSIS — E860 Dehydration: Secondary | ICD-10-CM | POA: Diagnosis present

## 2020-10-09 DIAGNOSIS — E861 Hypovolemia: Secondary | ICD-10-CM | POA: Diagnosis present

## 2020-10-09 DIAGNOSIS — E871 Hypo-osmolality and hyponatremia: Secondary | ICD-10-CM | POA: Diagnosis present

## 2020-10-09 DIAGNOSIS — Z955 Presence of coronary angioplasty implant and graft: Secondary | ICD-10-CM | POA: Diagnosis not present

## 2020-10-09 DIAGNOSIS — I2510 Atherosclerotic heart disease of native coronary artery without angina pectoris: Secondary | ICD-10-CM | POA: Diagnosis present

## 2020-10-09 DIAGNOSIS — G894 Chronic pain syndrome: Secondary | ICD-10-CM | POA: Diagnosis present

## 2020-10-09 DIAGNOSIS — E878 Other disorders of electrolyte and fluid balance, not elsewhere classified: Secondary | ICD-10-CM | POA: Diagnosis present

## 2020-10-09 DIAGNOSIS — D631 Anemia in chronic kidney disease: Secondary | ICD-10-CM | POA: Diagnosis present

## 2020-10-09 DIAGNOSIS — E872 Acidosis: Secondary | ICD-10-CM | POA: Diagnosis present

## 2020-10-09 LAB — CBC
HCT: 30.8 % — ABNORMAL LOW (ref 39.0–52.0)
Hemoglobin: 11.3 g/dL — ABNORMAL LOW (ref 13.0–17.0)
MCH: 36.1 pg — ABNORMAL HIGH (ref 26.0–34.0)
MCHC: 36.7 g/dL — ABNORMAL HIGH (ref 30.0–36.0)
MCV: 98.4 fL (ref 80.0–100.0)
Platelets: 167 10*3/uL (ref 150–400)
RBC: 3.13 MIL/uL — ABNORMAL LOW (ref 4.22–5.81)
RDW: 11.9 % (ref 11.5–15.5)
WBC: 5.4 10*3/uL (ref 4.0–10.5)
nRBC: 0 % (ref 0.0–0.2)

## 2020-10-09 LAB — URINALYSIS, COMPLETE (UACMP) WITH MICROSCOPIC
Bacteria, UA: NONE SEEN
Bilirubin Urine: NEGATIVE
Glucose, UA: NEGATIVE mg/dL
Ketones, ur: NEGATIVE mg/dL
Leukocytes,Ua: NEGATIVE
Nitrite: NEGATIVE
Protein, ur: NEGATIVE mg/dL
Specific Gravity, Urine: 1.015 (ref 1.005–1.030)
pH: 5 (ref 5.0–8.0)

## 2020-10-09 LAB — PROCALCITONIN: Procalcitonin: 0.25 ng/mL

## 2020-10-09 LAB — BASIC METABOLIC PANEL
Anion gap: 5 (ref 5–15)
BUN: 26 mg/dL — ABNORMAL HIGH (ref 8–23)
CO2: 25 mmol/L (ref 22–32)
Calcium: 8.5 mg/dL — ABNORMAL LOW (ref 8.9–10.3)
Chloride: 100 mmol/L (ref 98–111)
Creatinine, Ser: 0.86 mg/dL (ref 0.61–1.24)
GFR, Estimated: >60 mL/min (ref >60)
Glucose, Bld: 86 mg/dL (ref 70–99)
Potassium: 4.2 mmol/L (ref 3.5–5.1)
Sodium: 130 mmol/L — ABNORMAL LOW (ref 135–145)

## 2020-10-09 LAB — LACTIC ACID, PLASMA: Lactic Acid, Venous: 1.8 mmol/L (ref 0.5–1.9)

## 2020-10-09 MED ORDER — LEVOTHYROXINE SODIUM 100 MCG PO TABS
100.0000 ug | ORAL_TABLET | Freq: Every day | ORAL | Status: DC
  Administered 2020-10-09 – 2020-10-10 (×2): 100 ug via ORAL
  Filled 2020-10-09 (×2): qty 1

## 2020-10-09 MED ORDER — ONDANSETRON HCL 4 MG/2ML IJ SOLN: 4.0000 mg | Freq: Four times a day (QID) | INTRAMUSCULAR | Status: DC | PRN

## 2020-10-09 MED ORDER — MAGNESIUM HYDROXIDE 400 MG/5ML PO SUSP
30.0000 mL | Freq: Every day | ORAL | Status: DC | PRN
  Filled 2020-10-09: qty 30

## 2020-10-09 MED ORDER — ACETAMINOPHEN 325 MG PO TABS
650.0000 mg | ORAL_TABLET | Freq: Four times a day (QID) | ORAL | Status: DC | PRN
  Administered 2020-10-10: 08:00:00 650 mg via ORAL
  Filled 2020-10-09: qty 2

## 2020-10-09 MED ORDER — APIXABAN 5 MG PO TABS
5.0000 mg | ORAL_TABLET | Freq: Two times a day (BID) | ORAL | Status: DC
  Administered 2020-10-09 – 2020-10-10 (×3): 5 mg via ORAL
  Filled 2020-10-09: qty 1
  Filled 2020-10-09: qty 2
  Filled 2020-10-09: qty 1

## 2020-10-09 MED ORDER — TRAZODONE HCL 50 MG PO TABS: 25.0000 mg | ORAL_TABLET | Freq: Every evening | ORAL | Status: DC | PRN

## 2020-10-09 MED ORDER — SODIUM CHLORIDE 0.9 % IV SOLN: INTRAVENOUS | Status: DC

## 2020-10-09 MED ORDER — PANTOPRAZOLE SODIUM 40 MG PO TBEC
40.0000 mg | DELAYED_RELEASE_TABLET | Freq: Every day | ORAL | Status: DC
  Administered 2020-10-09 – 2020-10-10 (×2): 40 mg via ORAL
  Filled 2020-10-09 (×2): qty 1

## 2020-10-09 MED ORDER — LINACLOTIDE 72 MCG PO CAPS
72.0000 ug | ORAL_CAPSULE | Freq: Every day | ORAL | Status: DC
  Administered 2020-10-10: 08:00:00 72 ug via ORAL
  Filled 2020-10-09 (×2): qty 1

## 2020-10-09 MED ORDER — NITROGLYCERIN 0.4 MG SL SUBL: 0.4000 mg | SUBLINGUAL_TABLET | SUBLINGUAL | Status: DC | PRN

## 2020-10-09 MED ORDER — ONDANSETRON HCL 4 MG PO TABS: 4.0000 mg | ORAL_TABLET | Freq: Four times a day (QID) | ORAL | Status: DC | PRN

## 2020-10-09 MED ORDER — ACETAMINOPHEN 650 MG RE SUPP: 650.0000 mg | Freq: Four times a day (QID) | RECTAL | Status: DC | PRN

## 2020-10-09 NOTE — Evaluation (Signed)
Physical Therapy Evaluation Patient Details Name: Louis Wells MRN: [id number] DOB: 1932-05-07 Today's Date: 10/09/2020   History of Present Illness  87 y.o. Caucasian male with medical history significant for atrial fibrillation, hypertension, dyslipidemia, coronary artery disease and hypothyroidism, who presented to emergency room with acute onset of generalized weakness and altered mental status.  He has been feeling weaker over the last couple of days.  When his family came over to check on him today, he sat on the toilet for a couple of hours as he was significantly weak and could not get up.  He was noted to be confused.  When EMS came he was found to be hypotensive.  He was given IV fluids then.  He denied any chest pain or dyspnea or cough or wheezing.  No nausea or vomiting or diarrhea or abdominal pain.  Clinical Impression  Pt did relatively well with mobility and ambulation (~175 ft with walker, w/o excessive UE reliance).  Pt reports some general stiffness from being in bed but he and daughter report he is close to his baseline and despite his having arthritis in seemingly every joint he is able to be out of the home nearly every day for dinner and he/daughter feel confident with him being able to manage at home.    Follow Up Recommendations No PT follow up (will maintain on caseload while admitted to maximize safety for return home)    Equipment Recommendations  None recommended by PT    Recommendations for Other Services       Precautions / Restrictions Precautions Precautions: Fall Restrictions Weight Bearing Restrictions: No      Mobility  Bed Mobility Overal bed mobility: Modified Independent Bed Mobility: Supine to Sit;Sit to Supine     Supine to sit: Min guard Sit to supine: Min guard   General bed mobility comments: Pt able to get to EOB w/o assist, used UEs to pull LEs back into bed but able to transition w/o direct assist    Transfers Overall transfer  level: Needs assistance Equipment used: Rolling walker (2 wheeled) Transfers: Sit to/from Stand Sit to Stand: Min guard         General transfer comment: Pt showed good effort in getting up to standing w/o need for direct assist.  Forward leaning on walker once upright but no LOBs and apart from being stiff from being in bed feeling relatively well.  Ambulation/Gait Ambulation/Gait assistance: Supervision Gait Distance (Feet): 175 Feet Assistive device: Rolling walker (2 wheeled)       General Gait Details: Pt with very kyphotic posture but good confidence with FWW.  He had audible creaking in joints t/o the effort but reports that apart from being a little stiff from being in bed does not feel too far from his baseline.  Pt's vitals were stable/appropriate t/o the effort and he had no LOBs or overt safety issues.  Stairs            Wheelchair Mobility    Modified Rankin (Stroke Patients Only)       Balance Overall balance assessment: Needs assistance Sitting-balance support: No upper extremity supported Sitting balance-Leahy Scale: Good     Standing balance support: Bilateral upper extremity supported Standing balance-Leahy Scale: Good                               Pertinent Vitals/Pain Pain Assessment: No/denies pain    Home Living Family/patient expects to  be discharged to:: Private residence Living Arrangements: Alone Available Help at Discharge: Family;Available 24 hours/day (typically he stays alone at night but daughter lives right next door, family there t/o the day) Type of Home: House Home Access: Ramped entrance     Home Layout: One level Home Equipment: Environmental consultant - 2 wheels;Cane - single point;Electric scooter      Prior Function Level of Independence: Independent with assistive device(s)         Comments: Pt lives at home alone. His children are in and out during the day. They set up pill box and he takes medications on his own.  Family reports he does fix some food and they take him out almost daily. He drives on occasion.     Hand Dominance   Dominant Hand: Right    Extremity/Trunk Assessment   Upper Extremity Assessment Upper Extremity Assessment: Generalized weakness (severe OA limitations, no shld elevation b/l.   functional grip and elbow strength)    Lower Extremity Assessment Lower Extremity Assessment: Generalized weakness (R knee genu valgus, L genu varus)       Communication   Communication:  (does not read or write)  Cognition Arousal/Alertness: Awake/alert Behavior During Therapy: WFL for tasks assessed/performed Overall Cognitive Status: Difficult to assess                                 General Comments: Family report he is at his baseline (does not read or write), not oriented to time. Pt needing increased time to process information.      General Comments General comments (skin integrity, edema, etc.): Pt was able to move relatively well and he/daughter report not feeling too far from baseline    Exercises     Assessment/Plan    PT Assessment Patient needs continued PT services  PT Problem List Decreased strength;Decreased range of motion;Decreased activity tolerance;Decreased knowledge of use of DME;Decreased safety awareness       PT Treatment Interventions DME instruction;Gait training;Functional mobility training;Therapeutic activities;Therapeutic exercise;Balance training;Neuromuscular re-education;Patient/family education    PT Goals (Current goals can be found in the Care Plan section)  Acute Rehab PT Goals Patient Stated Goal: to go home PT Goal Formulation: With patient/family Time For Goal Achievement: 10/23/20 Potential to Achieve Goals: Good    Frequency Min 2X/week   Barriers to discharge        Co-evaluation               AM-PAC PT "6 Clicks" Mobility  Outcome Measure Help needed turning from your back to your side while in a flat  bed without using bedrails?: None Help needed moving from lying on your back to sitting on the side of a flat bed without using bedrails?: None Help needed moving to and from a bed to a chair (including a wheelchair)?: None Help needed standing up from a chair using your arms (e.g., wheelchair or bedside chair)?: None Help needed to walk in hospital room?: A Little Help needed climbing 3-5 steps with a railing? : A Little 6 Click Score: 22    End of Session Equipment Utilized During Treatment: Gait belt Activity Tolerance: Patient tolerated treatment well Patient left: in bed;with call bell/phone within reach;with family/visitor present Nurse Communication: Mobility status PT Visit Diagnosis: Muscle weakness (generalized) (M62.81);Difficulty in walking, not elsewhere classified (R26.2)    Time: 9735-3299 PT Time Calculation (min) (ACUTE ONLY): 23 min   Charges:   PT  Evaluation $PT Eval Low Complexity: 1 Low PT Treatments $Gait Training: 8-22 mins        Malachi Pro, DPT 10/09/2020, 1:28 PM

## 2020-10-09 NOTE — Evaluation (Signed)
Occupational Therapy Evaluation Patient Details Name: Louis Wells MRN: [id number] DOB: 06-06-1932 Today's Date: 10/09/2020    History of Present Illness 87 y.o. Caucasian male with medical history significant for atrial fibrillation, hypertension, dyslipidemia, coronary artery disease and hypothyroidism, who presented to emergency room with acute onset of generalized weakness and altered mental status.  He has been feeling weaker over the last couple of days.  When his family came over to check on him today, he sat on the toilet for a couple of hours as he was significantly weak and could not get up.  He was noted to be confused.  When EMS came he was found to be hypotensive.  He was given IV fluids then.  He denied any chest pain or dyspnea or cough or wheezing.  No nausea or vomiting or diarrhea or abdominal pain.   Clinical Impression   Patient presenting with decreased Ind in self care, balalnce, functional mobility/transfers, endurance, and safety awareness. Patient and his daughter are present in the room. Pt reports living at home alone with family in and out during the day to assist as needed. Pt performs sink baths and dresses self independently at baseline. Family endorse pt uses RW in community but often uses Art gallery manager in the home. They fix pill box and he takes medication. Patient needing mod A to EOB and min of 2 HHA for 2-3 side steps along bed. Pt fatigues quickly. OT discussed recommendation for short term rehab as pt is not close to baseline. Family may not agree with recommendation and may want to take him home but unsure they can provide this level of care. Patient will benefit from acute OT to increase overall independence in the areas of ADLs, functional mobility, safety awareness in order to safely discharge to next venue of care.      Follow Up Recommendations  SNF (if family/pt refuse SNF he will need 24/7 supervision assist and recommendation for 3 in 1 [facility])    Equipment Recommendations  None recommended by OT       Precautions / Restrictions Precautions Precautions: Fall      Mobility Bed Mobility Overal bed mobility: Needs Assistance Bed Mobility: Supine to Sit;Sit to Supine     Supine to sit: Min assist Sit to supine: Mod assist   General bed mobility comments: assistance for trunk support and R LE    Transfers Overall transfer level: Needs assistance Equipment used: 2 person hand held assist Transfers: Sit to/from Stand Sit to Stand: Min assist;+2 physical assistance         General transfer comment: Pt able to take 2-3 side steps along bed only    Balance Overall balance assessment: Needs assistance Sitting-balance support: Feet supported;Bilateral upper extremity supported Sitting balance-Leahy Scale: Good                                     ADL either performed or assessed with clinical judgement   ADL Overall ADL's : Needs assistance/impaired                     Lower Body Dressing: Minimal assistance;Sit to/from stand               Functional mobility during ADLs: Minimal assistance;+2 for physical assistance       Vision Patient Visual Report: No change from baseline  Pertinent Vitals/Pain Pain Assessment: No/denies pain     Hand Dominance Right   Extremity/Trunk Assessment Upper Extremity Assessment Upper Extremity Assessment: Overall WFL for tasks assessed;Generalized weakness   Lower Extremity Assessment Lower Extremity Assessment: Overall WFL for tasks assessed;Generalized weakness       Communication Communication Communication: HOH   Cognition Arousal/Alertness: Awake/alert Behavior During Therapy: WFL for tasks assessed/performed                                   General Comments: Family report he is at his baseline. He is not oriented to time. He told me it was April and the year as "40". Pt needing increased time to  process information.              Home Living Family/patient expects to be discharged to:: Private residence Living Arrangements: Alone Available Help at Discharge: Family;Available 24 hours/day Type of Home: House Home Access: Ramped entrance     Home Layout: One level               Home Equipment: Walker - 2 wheels;Cane - single point;Electric scooter          Prior Functioning/Environment Level of Independence: Independent with assistive device(s)        Comments: Pt lives at home alone. His children are in and out during the day. They set up pill box and he takes medications on his own. Family reports he does fix some food and they take him out as well. He drives on occasion.        OT Problem List: Decreased strength;Decreased activity tolerance;Decreased cognition;Impaired balance (sitting and/or standing);Decreased safety awareness      OT Treatment/Interventions: Self-care/ADL training;Therapeutic exercise;Energy conservation;Modalities;Balance training;DME and/or AE instruction;Manual therapy;Therapeutic activities;Patient/family education    OT Goals(Current goals can be found in the care plan section) Acute Rehab OT Goals Patient Stated Goal: to go home OT Goal Formulation: With patient/family Time For Goal Achievement: 10/23/20 Potential to Achieve Goals: Fair ADL Goals Pt Will Perform Grooming: (P) with modified independence Pt Will Perform Lower Body Dressing: (P) with supervision Pt Will Transfer to Toilet: (P) with supervision Pt Will Perform Toileting - Clothing Manipulation and hygiene: (P) with supervision  OT Frequency: Min 2X/week   Barriers to D/C:    none known at this time          AM-PAC OT "6 Clicks" Daily Activity     Outcome Measure Help from another person eating meals?: None Help from another person taking care of personal grooming?: A Little Help from another person toileting, which includes using toliet, bedpan, or  urinal?: A Lot Help from another person bathing (including washing, rinsing, drying)?: A Lot Help from another person to put on and taking off regular upper body clothing?: A Little Help from another person to put on and taking off regular lower body clothing?: A Lot 6 Click Score: 16   End of Session Nurse Communication: Mobility status  Activity Tolerance: Patient limited by fatigue Patient left: in bed;with call bell/phone within reach;with family/visitor present  OT Visit Diagnosis: Unsteadiness on feet (R26.81);Muscle weakness (generalized) (M62.81)                Time: 7001-7494 OT Time Calculation (min): 22 min Charges:  OT General Charges $OT Visit: 1 Visit OT Evaluation $OT Eval Moderate Complexity: 1 Mod OT Treatments $Therapeutic Activity: 8-22 mins  Jackquline Denmark, MS, OTR/L ,  CBIS ascom [phone]  10/09/20, 11:30 AM

## 2020-10-09 NOTE — ED Provider Notes (Signed)
-----------------------------------------   12:18 AM on 10/09/2020 -----------------------------------------   Urine appeared grossly dirty but UA unremarkable.  IV cefepime and vancomycin infusing.  Lactic acid improved.  Will discuss with hospitalist services for admission.   Irean Hong, MD 10/09/20 [phone]

## 2020-10-09 NOTE — ED Notes (Signed)
Michelle RN aware of assigned bed 

## 2020-10-09 NOTE — Progress Notes (Signed)
Brief hospitalist update note.  This is a nonbillable note.  Please see same-day H&P from Dr. Cristina Gong for full billable details.  Briefly, this is a 87 year old male that was brought in by his daughter due to complaints of worsening weakness and fatigue over the past several days.  P.o. intake has not been great.  Patient appears clinically dehydrated on exam.  Low sodium.  Started on IV fluids with improvement in sodium levels.  Needs therapy evaluations.  Consideration for home health or placement.  Possible discharge in 24 hours.  Lolita Patella MD

## 2020-10-09 NOTE — ED Notes (Signed)
Pt sheets changed; pt repositioned for comfort. No additional needs verbalized at this time.  Pt & family updated on inpatient room status. Bed low & locked; call light & personal items within reach.

## 2020-10-09 NOTE — H&P (Signed)
[place]   PATIENT NAME: Louis Wells    MR#:  [id number]  DATE OF BIRTH:  [birth year]  DATE OF ADMISSION:  10/08/2020  PRIMARY CARE PHYSICIAN: Barbette Reichmann, MD   Patient is coming from: Home  REQUESTING/REFERRING PHYSICIAN: Chiquita Loth, MD  CHIEF COMPLAINT:   Chief Complaint  Patient presents with   Altered Mental Status    HISTORY OF PRESENT ILLNESS:  Louis Wells is a 87 y.o. Caucasian male with medical history significant for atrial fibrillation, hypertension, dyslipidemia, coronary artery disease and hypothyroidism, who presented to emergency room with acute onset of generalized weakness and altered mental status.  He has been feeling weaker over the last couple of days.  When his family came over to check on him today, he sat on the toilet for a couple of hours as he was significantly weak and could not get up.  He was noted to be confused.  When EMS came he was found to be hypotensive.  He was given IV fluids then.  He denied any chest pain or dyspnea or cough or wheezing.  No nausea or vomiting or diarrhea or abdominal pain.  No dysuria, oliguria or hematuria or urgency or frequency or flank pain.  His daughter however noted offensive urine odor.  The patient has been having mild rhinorrhea without sore throat or earache.  No chest pain or palpitations.  No reported headache or dizziness or blurred vision, paresthesias or focal muscle weakness.  He has been having diminished p.o. intake since yesterday.  ED Course: Upon presentation to the emergency room blood pressure was 82/55 with otherwise normal vital signs.  Blood pressure came up to 113/68 with hydration.  Heart rate was normal and later 101 and 68 respiratory rate was up to 26.  Labs remarkable for hyponatremia and hypochloremia and a BN of 31 with creatinine 1.2 and albumin was 3.4 with total protein 5.9.  Total bili was 1.5 and AST 59.  Lactic acid was 2.6 and later 1.8.  CBC showed mild anemia.  INR was  1.9 with PTT of 21.8.  PTT was 34.  Urinalysis was unremarkable and influenza antigens and COVID-19 PCR came back negative.  Blood and urine culture were sent.    EKG as reviewed by me : EKG showed atrial fibrillation with controlled response of 71 and Q waves anteriorly Imaging: Chest x-ray showed moderate sized hiatal hernia with no acute cardiopulmonary disease.  The patient was given IV cefepime and vancomycin as well as 500 mL IV lactated ringer.  He will be admitted to a medical monitored bed for further evaluation and management. PAST MEDICAL HISTORY:   Past Medical History:  Diagnosis Date   Arthralgia of ankle or foot 11/20/2014   Atrial fibrillation (HCC)    Degenerative arthritis of hip 04/05/2015   Hyperlipidemia    Hypertension    Myocardial infarction [facility])    Primary osteoarthritis of right knee 05/22/2015   Thyroid disease     PAST SURGICAL HISTORY:   Past Surgical History:  Procedure Laterality Date   CORONARY ANGIOPLASTY WITH STENT PLACEMENT      SOCIAL HISTORY:   Social History   Tobacco Use   Smoking status: Former   Smokeless tobacco: Never  Substance Use Topics   Alcohol use: No    Alcohol/week: 0.0 standard drinks    FAMILY HISTORY:   Family History  Problem Relation Age of Onset   Cancer Mother    Diabetes Father  DRUG ALLERGIES:   Allergies  Allergen Reactions   Contrast Media [Iodinated Diagnostic Agents] Shortness Of Breath   Amoxicillin    Sulfa Antibiotics     Other reaction(s): Unknown Uncoded Allergy. Allergen: IV contrast    REVIEW OF SYSTEMS:   ROS As per history of present illness. All pertinent systems were reviewed above. Constitutional, HEENT, cardiovascular, respiratory, GI, GU, musculoskeletal, neuro, psychiatric, endocrine, integumentary and hematologic systems were reviewed and are otherwise negative/unremarkable except for positive findings mentioned above in the HPI.   MEDICATIONS AT HOME:   Prior to  Admission medications   Medication Sig Start Date End Date Taking? Authorizing Provider  apixaban (ELIQUIS) 5 MG TABS tablet Take 5 mg by mouth 2 (two) times daily.  10/22/15  Yes [provider]  furosemide (LASIX) 20 MG tablet Take 20 mg by mouth every other day. 09/19/20  Yes [provider]  levothyroxine (SYNTHROID) 100 MCG tablet Take 100 mcg by mouth every morning. 08/28/20  Yes [provider]  LINZESS 72 MCG capsule Take 72 mcg by mouth daily. 08/04/20  Yes [provider]  lisinopril (PRINIVIL,ZESTRIL) 20 MG tablet Take 20 mg by mouth daily.   Yes [provider]  metoprolol succinate (TOPROL-XL) 100 MG 24 hr tablet Take 100 mg by mouth daily. 08/27/20  Yes [provider]  pantoprazole (PROTONIX) 40 MG tablet Take 40 mg by mouth daily. 08/07/20  Yes [provider]  nitroGLYCERIN (NITROSTAT) 0.4 MG SL tablet Place under the tongue.    [provider]      VITAL SIGNS:  Blood pressure 135/78, pulse (!) 101, temperature 98.7 F (37.1 C), temperature source Oral, resp. rate (!) 26, SpO2 96 %.  PHYSICAL EXAMINATION:  Physical Exam  GENERAL:  87 y.o.-year-old Caucasian male patient lying in the bed with no acute distress.  He was fairly somnolent during my interview. EYES: Pupils equal, round, reactive to light and accommodation. No scleral icterus. Extraocular muscles intact.  HEENT: Head atraumatic, normocephalic. Oropharynx and nasopharynx clear.  NECK:  Supple, no jugular venous distention. No thyroid enlargement, no tenderness.  LUNGS: Normal breath sounds bilaterally, no wheezing, rales,rhonchi or crepitation. No use of accessory muscles of respiration.  CARDIOVASCULAR: Regular rate and rhythm, S1, S2 normal. No murmurs, rubs, or gallops.  ABDOMEN: Soft, nondistended, nontender. Bowel sounds present. No organomegaly or mass.  EXTREMITIES: No pedal edema, cyanosis, or clubbing.  NEUROLOGIC: Cranial nerves II  through XII are intact. Muscle strength 5/5 in all extremities. Sensation intact. Gait not checked.  PSYCHIATRIC: The patient is fairly somnolent during the interview.  He was just talking per his family prior to sleeping.   SKIN: No obvious rash, lesion, or ulcer.   LABORATORY PANEL:   CBC Recent Labs  Lab 10/08/20 2135  WBC 7.7  HGB 12.4*  HCT 33.8*  PLT 219   ------------------------------------------------------------------------------------------------------------------  Chemistries  Recent Labs  Lab 10/08/20 2135  NA 127*  K 4.1  CL 97*  CO2 21*  GLUCOSE 122*  BUN 31*  CREATININE 1.20  CALCIUM 8.7*  AST 59*  ALT 16  ALKPHOS 80  BILITOT 1.5*   ------------------------------------------------------------------------------------------------------------------  Cardiac Enzymes No results for input(s): TROPONINI in the last 168 hours. ------------------------------------------------------------------------------------------------------------------  RADIOLOGY:  DG Chest Port 1 View  Result Date: 10/08/2020 CLINICAL DATA:  Questionable sepsis. EXAM: PORTABLE CHEST 1 VIEW COMPARISON:  Chest radiograph dated 12/09/2005 and CT dated 05/07/2007. FINDINGS: No focal consolidation, pleural effusion or pneumothorax. Bibasilar atelectasis. Borderline cardiomegaly. Moderate size hiatal  hernia. Degenerative changes of the spine and shoulders. No acute osseous pathology. IMPRESSION: 1. No acute cardiopulmonary process. 2. Moderate size hiatal hernia. Electronically Signed   By: Elgie Collard M.D.   On: 10/08/2020 21:29      IMPRESSION AND PLAN:  Active Problems:   Dehydration  1.  Hypotension likely secondary to dehydration and hypovolemia with subsequent mild acute kidney injury. - The patient will be admitted to a medical monitored bed. - She will be hydrated with IV normal saline. - We will follow his BMP. - We will avoid nephrotoxins.  2.  Elevated lactic acid  level. - I suspect that this is related to viral depletion and dehydration. - At this time there is no current clear infectious etiology and he has been afebrile with no leukocytosis. - We will add procalcitonin level as well.  3.  Hyponatremia. - This is likely hypovolemic due to volume depletion as mentioned above. - We will hydrate with normal saline and follow BMP.  4.  Chronic atrial fibrillation with currently controlled ventricular response. - We will continue Eliquis and Toprol-XL with improvement of his blood pressure.  5.  Coronary artery disease. - We will continue beta-blocker therapy as well as as needed sublingual nitroglycerin.  DVT prophylaxis: Eliquis.   Code Status: full code. Family Communication:  The plan of care was discussed in details with the patient (and family). I answered all questions. The patient agreed to proceed with the above mentioned plan. Further management will depend upon hospital course. Disposition Plan: Back to previous home environment Consults called: none. All the records are reviewed and case discussed with ED provider.  Status is: Inpatient  Remains inpatient appropriate because:Altered mental status, Ongoing diagnostic testing needed not appropriate for outpatient work up, Unsafe d/c plan, IV treatments appropriate due to intensity of illness or inability to take PO, and Inpatient level of care appropriate due to severity of illness  Dispo: The patient is from: Home              Anticipated d/c is to: Home              Patient currently is not medically stable to d/c.   Difficult to place patient No  TOTAL TIME TAKING CARE OF THIS PATIENT: 55 minutes.    Hannah Beat M.D on 10/09/2020 at 1:04 AM  [facility]   From 7 PM-7 AM, contact night-coverage [email or web address]  CC: Primary care physician; Barbette Reichmann, MD

## 2020-10-09 NOTE — ED Notes (Signed)
PT at bedside working with pt.

## 2020-10-09 NOTE — ED Notes (Signed)
Pt resting comfortably in bed, NAD. Family at [facility]. No needs verbalized at this time. Bed low & locked; call light within reach.

## 2020-10-10 LAB — URINE CULTURE: Culture: 1000 — AB

## 2020-10-10 NOTE — Discharge Summary (Signed)
Physician Discharge Summary  Louis Wells ATF:[id number] DOB: 10/22/1932 DOA: 10/08/2020  PCP: Louis Reichmann, MD  Admit date: 10/08/2020 Discharge date: 10/10/2020  Admitted From: Home  Disposition:  Home   Recommendations for Outpatient Follow-up:  Follow up with Dr. Marcello Wells PCP in 1 week Dr. Marcello Wells: Please check BMP and BP and restart antihypertensive if needed      Home Health: None   Equipment/Devices: None new  Discharge Condition: Good  CODE STATUS: FULL Diet recommendation: Regular  Brief/Interim Summary: Mr. Louis Wells is an 87 y.o. M with Afib, HTN, CAD and hypothyroidism who presented with weakness and confusion over days.  Family came to check on him day of admission, and he had been on the toilet for several hours and too weak to get up.  Also seemed confused.  In the ER he was hypotensive, started on fluids.     PRINCIPAL HOSPITAL DIAGNOSIS: Hypotension due to furosemide    Discharge Diagnoses:  Hypotension due to dehydration/hypovolemia Creatinine minimally elevated, I do not think this was a acute kidney injury and sepsis was ruled out.  More likely just some dehydration.  Due to furosemide.  Furosemide was held, he was given IV fluids and his symptoms resolved. -Stop furosemide - Follow-up with PCP  Acute metabolic encephalopathy  At baseline the patient is interactive, has no significant memory loss, mostly independent.  On arrival to the ER because of dehydration, he was confused, could not give coherent answers.  Hyponatremia Due to hypovolemia  Chronic atrial fibrillation Continued on Eliquis and Toprol          Discharge Instructions  Discharge Instructions     Diet - low sodium heart healthy   Complete by: As directed    Discharge instructions   Complete by: As directed    From Dr. Maryfrances Wells: You were admitted for low blood pressure. This was likely due to dehydration from the new furosemide  STOP furosemide at  discharge Call Dr. Marcello Wells for a follow up appointment within 1 week  At home, check your blood pressure If it is OVER 160/100 mmHg (either number), then you can restart your home lisinopril  Otherwise, wait until you see Dr. Marcello Wells before restarting lisinopril  When you see Dr. Marcello Wells, ask him if you should restart metoprolol.  Drink plenty of fluids.   Increase activity slowly   Complete by: As directed       Allergies as of 10/10/2020       Reactions   Contrast Media [iodinated Diagnostic Agents] Shortness Of Breath   Amoxicillin    Sulfa Antibiotics    Other reaction(s): Unknown Uncoded Allergy. Allergen: IV contrast        Medication List     STOP taking these medications    furosemide 20 MG tablet Commonly known as: LASIX   lisinopril 20 MG tablet Commonly known as: ZESTRIL   metoprolol succinate 100 MG 24 hr tablet Commonly known as: TOPROL-XL       TAKE these medications    apixaban 5 MG Tabs tablet Commonly known as: ELIQUIS Take 5 mg by mouth 2 (two) times daily.   levothyroxine 100 MCG tablet Commonly known as: SYNTHROID Take 100 mcg by mouth every morning.   Linzess 72 MCG capsule Generic drug: linaclotide Take 72 mcg by mouth daily.   nitroGLYCERIN 0.4 MG SL tablet Commonly known as: NITROSTAT Place under the tongue.   pantoprazole 40 MG tablet Commonly known as: PROTONIX Take 40 mg by mouth daily.  Allergies  Allergen Reactions   Contrast Media [Iodinated Diagnostic Agents] Shortness Of Breath   Amoxicillin    Sulfa Antibiotics     Other reaction(s): Unknown Uncoded Allergy. Allergen: IV contrast       Procedures/Studies: DG Chest Port 1 View  Result Date: 10/08/2020 CLINICAL DATA:  Questionable sepsis. EXAM: PORTABLE CHEST 1 VIEW COMPARISON:  Chest radiograph dated 12/09/2005 and CT dated 05/07/2007. FINDINGS: No focal consolidation, pleural effusion or pneumothorax. Bibasilar atelectasis. Borderline cardiomegaly.  Moderate size hiatal hernia. Degenerative changes of the spine and shoulders. No acute osseous pathology. IMPRESSION: 1. No acute cardiopulmonary process. 2. Moderate size hiatal hernia. Electronically Signed   By: Elgie Collard M.D.   On: 10/08/2020 21:29      Subjective: Feeling well.  No headache, dizziness, chest discomfort, dyspnea, fever, malaise.  Discharge Exam: Vitals:   10/10/20 0507 10/10/20 0815  BP: 96/62 97/61  Pulse: 71 82  Resp: 16 16  Temp:  99.3 F (37.4 C)  SpO2: 95% 99%   Vitals:   10/09/20 2319 10/10/20 0507 10/10/20 0815 10/10/20 1124  BP: 130/90 96/62 97/61    Pulse: 79 71 82   Resp: Temp: 98.2 F (36.8 C)  99.3 F (37.4 C)   TempSrc: Oral     SpO2: 100% 95% 99%   Weight:    70.9 kg  Height:    6' (1.829 m)    General: Pt is alert, awake, not in acute distress Cardiovascular: RRR, nl S1-S2, no murmurs appreciated.   No LE edema.   Respiratory: Normal respiratory rate and rhythm.  CTAB without rales or wheezes. Abdominal: Abdomen soft and non-tender.  No distension or HSM.   Neuro/Psych: Strength symmetric in upper and lower extremities.  Judgment and insight appear normal.   The results of significant diagnostics from this hospitalization (including imaging, microbiology, ancillary and laboratory) are listed below for reference.     Microbiology: Recent Results (from the past 240 hour(s))  Resp Panel by RT-PCR (Flu A&B, Covid) Nasopharyngeal Swab     Status: None   Collection Time: 10/08/20  9:35 PM   Specimen: Nasopharyngeal Swab; Nasopharyngeal(NP) swabs in vial transport medium  Result Value Ref Range Status   SARS Coronavirus 2 by RT PCR NEGATIVE NEGATIVE Final    Comment: (NOTE) SARS-CoV-2 target nucleic acids are NOT DETECTED.  The SARS-CoV-2 RNA is generally detectable in upper respiratory specimens during the acute phase of infection. The lowest concentration of SARS-CoV-2 viral copies this assay can detect is 138  copies/mL. A negative result does not preclude SARS-Cov-2 infection and should not be used as the sole basis for treatment or other patient management decisions. A negative result may occur with  improper specimen collection/handling, submission of specimen other than nasopharyngeal swab, presence of viral mutation(s) within the areas targeted by this assay, and inadequate number of viral copies(<138 copies/mL). A negative result must be combined with clinical observations, patient history, and epidemiological information. The expected result is Negative.  Fact Sheet for Patients:  BloggerCourse.com  Fact Sheet for Healthcare Providers:  SeriousBroker.it  This test is no t yet approved or cleared by the [place] FDA and  has been authorized for detection and/or diagnosis of SARS-CoV-2 by FDA under an Emergency Use Authorization (EUA). This EUA will remain  in effect (meaning this test can be used) for the duration of the COVID-19 declaration under Section 564(b)(1) of the Act, 21 U.S.C.section 360bbb-3(b)(1), unless the authorization is terminated  or revoked  sooner.       Influenza A by PCR NEGATIVE NEGATIVE Final   Influenza B by PCR NEGATIVE NEGATIVE Final    Comment: (NOTE) The Xpert Xpress SARS-CoV-2/FLU/RSV plus assay is intended as an aid in the diagnosis of influenza from Nasopharyngeal swab specimens and should not be used as a sole basis for treatment. Nasal washings and aspirates are unacceptable for Xpert Xpress SARS-CoV-2/FLU/RSV testing.  Fact Sheet for Patients: BloggerCourse.com  Fact Sheet for Healthcare Providers: SeriousBroker.it  This test is not yet approved or cleared by the [place] FDA and has been authorized for detection and/or diagnosis of SARS-CoV-2 by FDA under an Emergency Use Authorization (EUA). This EUA will remain in effect (meaning  this test can be used) for the duration of the COVID-19 declaration under Section 564(b)(1) of the Act, 21 U.S.C. section 360bbb-3(b)(1), unless the authorization is terminated or revoked.  Performed at [facility], [address] [place], [place] [address]   Blood culture (routine single)     Status: None (Preliminary result)   Collection Time: 10/08/20 10:20 PM   Specimen: BLOOD  Result Value Ref Range Status   Specimen Description BLOOD RIGHT ASSIST CONTROL  Final   Special Requests   Final    BOTTLES DRAWN AEROBIC AND ANAEROBIC Blood Culture adequate volume   Culture   Final    NO GROWTH 2 DAYS Performed at [facility], [address] [place], [place] [address]    Report Status PENDING  Incomplete  Urine Culture     Status: Abnormal   Collection Time: 10/08/20 11:24 PM   Specimen: In/Out Cath Urine  Result Value Ref Range Status   Specimen Description   Final    IN/OUT CATH URINE Performed at [facility], [address], [place], [place] [address]    Special Requests   Final    NONE Performed at [facility], [address] [place], [place] [address]    Culture (A)  Final    1,000 COLONIES/mL VIRIDANS STREPTOCOCCUS Standardized susceptibility testing for this organism is not available. Performed at [facility], [address], [place], [place] [address]    Report Status 10/10/2020 FINAL  Final     Labs: BNP (last 3 results) No results for input(s): BNP in the last 8760 hours. Basic Metabolic Panel: Recent Labs  Lab 10/08/20 2135 10/09/20 0544  NA 127* 130*  K 4.1 4.2  CL 97* 100  CO2 21* 25  GLUCOSE 122* 86  BUN 31* 26*  CREATININE 1.20 0.86  CALCIUM 8.7* 8.5*   Liver Function Tests: Recent Labs  Lab 10/08/20 2135  AST 59*  ALT 16  ALKPHOS 80  BILITOT 1.5*  PROT 5.9*  ALBUMIN 3.4*   No results for input(s): LIPASE, AMYLASE in the last 168 hours. No results for input(s): AMMONIA in the last  168 hours. CBC: Recent Labs  Lab 10/08/20 2135 10/09/20 0544  WBC 7.7 5.4  NEUTROABS 6.0  --   HGB 12.4* 11.3*  HCT 33.8* 30.8*  MCV 99.1 98.4  PLT 219 167   Cardiac Enzymes: No results for input(s): CKTOTAL, CKMB, CKMBINDEX, TROPONINI in the last 168 hours. BNP: Invalid input(s): POCBNP CBG: No results for input(s): GLUCAP in the last 168 hours. D-Dimer No results for input(s): DDIMER in the last 72 hours. Hgb A1c No results for input(s): HGBA1C in the last 72 hours. Lipid Profile No results for input(s): CHOL, HDL, LDLCALC, TRIG, CHOLHDL, LDLDIRECT in the last  72 hours. Thyroid function studies No results for input(s): TSH, T4TOTAL, T3FREE, THYROIDAB in the last 72 hours.  Invalid input(s): FREET3 Anemia work up No results for input(s): VITAMINB12, FOLATE, FERRITIN, TIBC, IRON, RETICCTPCT in the last 72 hours. Urinalysis    Component Value Date/Time   COLORURINE YELLOW (A) 10/08/2020 2324   APPEARANCEUR CLEAR (A) 10/08/2020 2324   LABSPEC 1.015 10/08/2020 2324   PHURINE 5.0 10/08/2020 2324   GLUCOSEU NEGATIVE 10/08/2020 2324   HGBUR MODERATE (A) 10/08/2020 2324   BILIRUBINUR NEGATIVE 10/08/2020 2324   KETONESUR NEGATIVE 10/08/2020 2324   PROTEINUR NEGATIVE 10/08/2020 2324   NITRITE NEGATIVE 10/08/2020 2324   LEUKOCYTESUR NEGATIVE 10/08/2020 2324   Sepsis Labs Invalid input(s): PROCALCITONIN,  WBC,  LACTICIDVEN Microbiology Recent Results (from the past 240 hour(s))  Resp Panel by RT-PCR (Flu A&B, Covid) Nasopharyngeal Swab     Status: None   Collection Time: 10/08/20  9:35 PM   Specimen: Nasopharyngeal Swab; Nasopharyngeal(NP) swabs in vial transport medium  Result Value Ref Range Status   SARS Coronavirus 2 by RT PCR NEGATIVE NEGATIVE Final    Comment: (NOTE) SARS-CoV-2 target nucleic acids are NOT DETECTED.  The SARS-CoV-2 RNA is generally detectable in upper respiratory specimens during the acute phase of infection. The lowest concentration of  SARS-CoV-2 viral copies this assay can detect is 138 copies/mL. A negative result does not preclude SARS-Cov-2 infection and should not be used as the sole basis for treatment or other patient management decisions. A negative result may occur with  improper specimen collection/handling, submission of specimen other than nasopharyngeal swab, presence of viral mutation(s) within the areas targeted by this assay, and inadequate number of viral copies(<138 copies/mL). A negative result must be combined with clinical observations, patient history, and epidemiological information. The expected result is Negative.  Fact Sheet for Patients:  BloggerCourse.com  Fact Sheet for Healthcare Providers:  SeriousBroker.it  This test is no t yet approved or cleared by the [place] FDA and  has been authorized for detection and/or diagnosis of SARS-CoV-2 by FDA under an Emergency Use Authorization (EUA). This EUA will remain  in effect (meaning this test can be used) for the duration of the COVID-19 declaration under Section 564(b)(1) of the Act, 21 U.S.C.section 360bbb-3(b)(1), unless the authorization is terminated  or revoked sooner.       Influenza A by PCR NEGATIVE NEGATIVE Final   Influenza B by PCR NEGATIVE NEGATIVE Final    Comment: (NOTE) The Xpert Xpress SARS-CoV-2/FLU/RSV plus assay is intended as an aid in the diagnosis of influenza from Nasopharyngeal swab specimens and should not be used as a sole basis for treatment. Nasal washings and aspirates are unacceptable for Xpert Xpress SARS-CoV-2/FLU/RSV testing.  Fact Sheet for Patients: BloggerCourse.com  Fact Sheet for Healthcare Providers: SeriousBroker.it  This test is not yet approved or cleared by the [place] FDA and has been authorized for detection and/or diagnosis of SARS-CoV-2 by FDA under an Emergency Use  Authorization (EUA). This EUA will remain in effect (meaning this test can be used) for the duration of the COVID-19 declaration under Section 564(b)(1) of the Act, 21 U.S.C. section 360bbb-3(b)(1), unless the authorization is terminated or revoked.  Performed at [facility], [address] [place], [place] [address]   Blood culture (routine single)     Status: None (Preliminary result)   Collection Time: 10/08/20 10:20 PM   Specimen: BLOOD  Result Value Ref Range Status   Specimen Description BLOOD RIGHT ASSIST CONTROL  Final   Special Requests   Final    BOTTLES DRAWN AEROBIC AND ANAEROBIC Blood Culture adequate volume   Culture   Final    NO GROWTH 2 DAYS Performed at [facility], [address] [place], [place] [address]    Report Status PENDING  Incomplete  Urine Culture     Status: Abnormal   Collection Time: 10/08/20 11:24 PM   Specimen: In/Out Cath Urine  Result Value Ref Range Status   Specimen Description   Final    IN/OUT CATH URINE Performed at [facility], [address] [place], [place] [address]    Special Requests   Final    NONE Performed at [facility], [address] [place], [place] [address]    Culture (A)  Final    1,000 COLONIES/mL VIRIDANS STREPTOCOCCUS Standardized susceptibility testing for this organism is not available. Performed at [facility], [address] [place], [place] [address]    Report Status 10/10/2020 FINAL  Final     Time coordinating discharge: 25 minutes   30 Day Unplanned Readmission Risk Score    Flowsheet Row ED to Hosp-Admission (Discharged) from 10/08/2020 in [facility] ON[facility] (1C)  30 Day Unplanned Readmission Risk Score (%) 22.36 Filed at 10/10/2020 1200       This score is the patient's risk of an unplanned readmission within 30 days of being discharged (0 -100%). The score is based on dignosis, age, lab data, medications, orders, and  past utilization.   Low:  0-14.9   Medium: 15-21.9   High: 22-29.9   Extreme: 30 and above            SIGNED:   Alberteen Samhristopher P Kermitt Harjo, MD  [facility] 10/10/2020, 5:34 PM

## 2020-10-10 NOTE — Progress Notes (Signed)
Initial Nutrition Assessment  DOCUMENTATION CODES:  Not applicable  INTERVENTION:  Continue current diet as ordered, encourage PO intake MVI with minerals daily If pt remains in hospital, will add supplement to encourage adequate nutrition and fluid intake  NUTRITION DIAGNOSIS:  Inadequate oral intake related to acute illness (prior to admission) as evidenced by per patient/family report.  GOAL:  Patient will meet greater than or equal to 90% of their needs  MONITOR:  PO intake, Weight trends, Labs  REASON FOR ASSESSMENT:  Consult Assessment of nutrition requirement/status  ASSESSMENT:  87 y.o. male who presented to ED from home with altered mental status and weakness worsening over the last few days. Found to be dehydrated in ED.   Pt resting in bed at the time of visit, family at bedside. Pt reports a good appetite this admission and that he is eating at his baseline. States at home he has 3 meals and snacks throughout the day. Daughter endorses his good intake as well. Typically drinks sprite and yoohoo drinks. Pt reports usual body weight of ~155 lb recently, denies changes in the way his clothes fit. Reviewed weight from PCP and has been stable for at least 6 months although weight loss is seen over the last several years. Pt also denies GI distress or difficulty chewing/swallowing.    Some mild/moderate muscle deficits seen on exam, but more consistent with normal muscle atrophy of aging versus malnutrition. Discussed in rounds, MD states pt will likely be discharged today  Average Meal Intake: 8/29-8/31: 90% intake x 1 recorded meal  Nutritionally Relevant Medications: Scheduled Meds:  levothyroxine  100 mcg Oral Q0600   linaclotide  72 mcg Oral Daily   pantoprazole  40 mg Oral Daily   PRN Meds: magnesium hydroxide, ondansetron  Labs Reviewed: Na 130 BUN 26  NUTRITION - FOCUSED PHYSICAL EXAM: Flowsheet Row Most Recent Value  Orbital Region Mild depletion  Upper  Arm Region Mild depletion  Thoracic and Lumbar Region No depletion  Buccal Region No depletion  Temple Region No depletion  Clavicle Bone Region Moderate depletion  Clavicle and Acromion Bone Region Moderate depletion  Scapular Bone Region Mild depletion  Dorsal Hand No depletion  Patellar Region No depletion  Anterior Thigh Region No depletion  Posterior Calf Region Mild depletion  Edema (RD Assessment) Mild  Hair Reviewed  Eyes Reviewed  Mouth Reviewed  Skin Reviewed  Nails Reviewed   Diet Order:   Diet Order             Diet - low sodium heart healthy           Diet regular Room service appropriate? Yes; Fluid consistency: Thin  Diet effective now                   EDUCATION NEEDS:  No education needs have been identified at this time  Skin:  Skin Assessment: Reviewed RN Assessment  Last BM:  PTA  Height:  Ht Readings from Last 1 Encounters:  10/10/20 6' (1.829 m)   Weight:  Wt Readings from Last 1 Encounters:  10/10/20 70.9 kg   Ideal Body Weight:  80.9 kg  BMI:  Body mass index is 21.2 kg/m.  Estimated Nutritional Needs:  Kcal:  1700-1900 kcal/d Protein:  85-95 g/d Fluid:  1.8-2.1 L/d   Greig Castilla, RD, LDN Clinical Dietitian Pager on Amion

## 2020-10-13 LAB — CULTURE, BLOOD (SINGLE)
Culture: NO GROWTH
Special Requests: ADEQUATE

## 2022-01-09 IMAGING — DX DG CHEST 1V PORT
1 series · 1 of 1 positions shown · non-contrast
Comparison: Chest radiograph dated 12/09/2005 and CT dated
05/07/2007.

CLINICAL DATA: Questionable sepsis.

EXAM:
PORTABLE CHEST 1 VIEW

[chest ap]
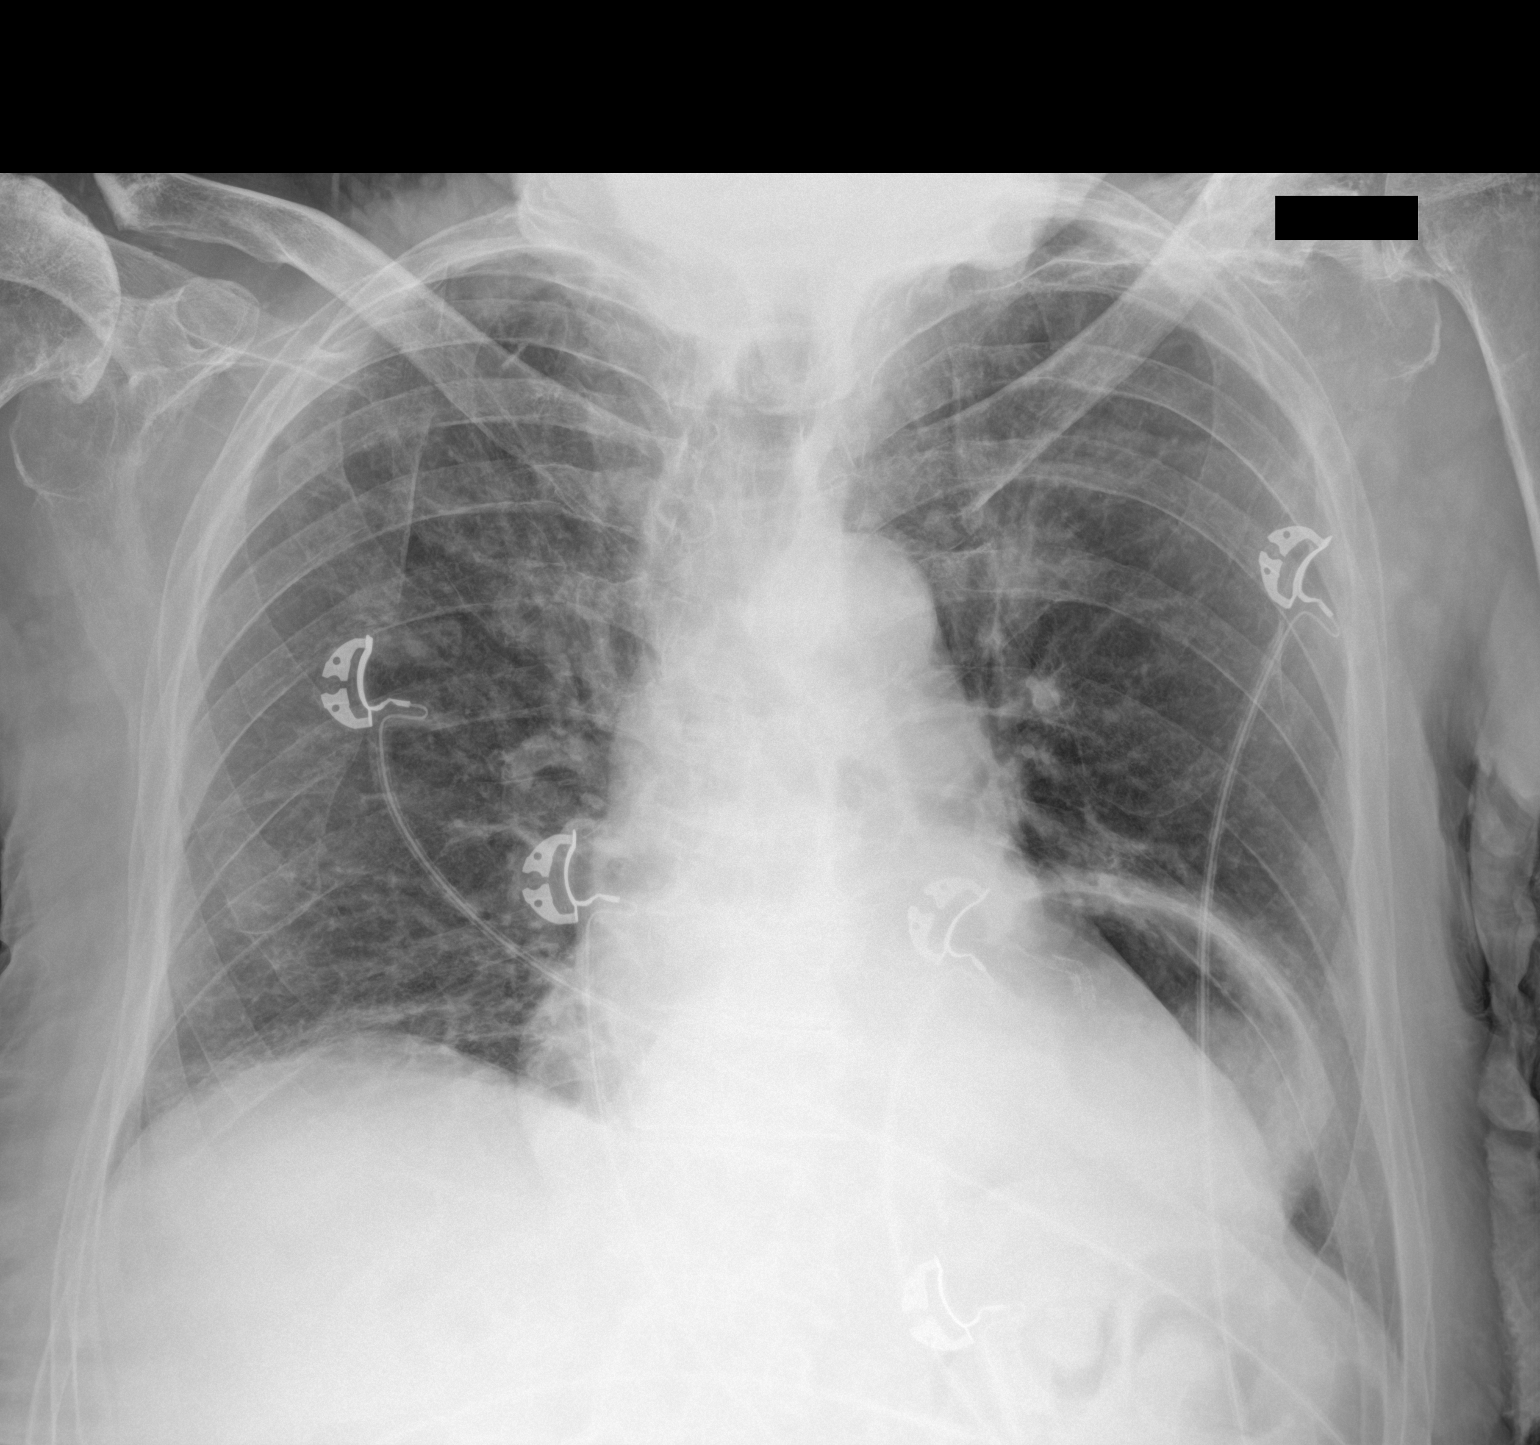

[1 of 1 positions shown; findings below may reference images not displayed]

FINDINGS: No focal consolidation, pleural effusion or pneumothorax. Bibasilar
atelectasis. Borderline cardiomegaly. Moderate size hiatal hernia.
Degenerative changes of the spine and shoulders. No acute osseous
pathology.
IMPRESSION: 1. No acute cardiopulmonary process.
2. Moderate size hiatal hernia.

## 2022-12-04 ENCOUNTER — Other Ambulatory Visit: Payer: Self-pay | Admitting: Physician Assistant

## 2022-12-04 ENCOUNTER — Ambulatory Visit
Admission: RE | Admit: 2022-12-04 | Discharge: 2022-12-04 | Disposition: A | Discharge status: Home / Self Care | Payer: Medicare Other | Source: Ambulatory Visit | Attending: Physician Assistant | Admitting: Physician Assistant

## 2022-12-04 DIAGNOSIS — S32010A Wedge compression fracture of first lumbar vertebra, initial encounter for closed fracture: Secondary | ICD-10-CM

## 2022-12-26 ENCOUNTER — Inpatient Hospital Stay
Admission: EM | Admit: 2022-12-26 | Discharge: 2022-12-30 | DRG: 064 | Disposition: A | Discharge status: Skilled Nursing Facility | Payer: Medicare Other | Attending: Internal Medicine | Admitting: Internal Medicine

## 2022-12-26 ENCOUNTER — Emergency Department: Payer: Medicare Other

## 2022-12-26 ENCOUNTER — Other Ambulatory Visit: Payer: Self-pay

## 2022-12-26 DIAGNOSIS — R410 Disorientation, unspecified: Secondary | ICD-10-CM | POA: Diagnosis not present

## 2022-12-26 DIAGNOSIS — M1611 Unilateral primary osteoarthritis, right hip: Secondary | ICD-10-CM | POA: Diagnosis present

## 2022-12-26 DIAGNOSIS — I5A Non-ischemic myocardial injury (non-traumatic): Secondary | ICD-10-CM | POA: Diagnosis not present

## 2022-12-26 DIAGNOSIS — I252 Old myocardial infarction: Secondary | ICD-10-CM

## 2022-12-26 DIAGNOSIS — Z87891 Personal history of nicotine dependence: Secondary | ICD-10-CM

## 2022-12-26 DIAGNOSIS — Z882 Allergy status to sulfonamides status: Secondary | ICD-10-CM

## 2022-12-26 DIAGNOSIS — I2510 Atherosclerotic heart disease of native coronary artery without angina pectoris: Secondary | ICD-10-CM | POA: Diagnosis present

## 2022-12-26 DIAGNOSIS — F0392 Unspecified dementia, unspecified severity, with psychotic disturbance: Secondary | ICD-10-CM | POA: Diagnosis present

## 2022-12-26 DIAGNOSIS — N400 Enlarged prostate without lower urinary tract symptoms: Secondary | ICD-10-CM | POA: Diagnosis present

## 2022-12-26 DIAGNOSIS — Z515 Encounter for palliative care: Secondary | ICD-10-CM

## 2022-12-26 DIAGNOSIS — M25551 Pain in right hip: Secondary | ICD-10-CM | POA: Diagnosis present

## 2022-12-26 DIAGNOSIS — Z8673 Personal history of transient ischemic attack (TIA), and cerebral infarction without residual deficits: Secondary | ICD-10-CM

## 2022-12-26 DIAGNOSIS — Z66 Do not resuscitate: Secondary | ICD-10-CM | POA: Diagnosis not present

## 2022-12-26 DIAGNOSIS — G9349 Other encephalopathy: Secondary | ICD-10-CM | POA: Diagnosis present

## 2022-12-26 DIAGNOSIS — Z91041 Radiographic dye allergy status: Secondary | ICD-10-CM

## 2022-12-26 DIAGNOSIS — I6389 Other cerebral infarction: Principal | ICD-10-CM | POA: Diagnosis present

## 2022-12-26 DIAGNOSIS — I639 Cerebral infarction, unspecified: Secondary | ICD-10-CM | POA: Diagnosis present

## 2022-12-26 DIAGNOSIS — K589 Irritable bowel syndrome without diarrhea: Secondary | ICD-10-CM | POA: Diagnosis present

## 2022-12-26 DIAGNOSIS — G9341 Metabolic encephalopathy: Secondary | ICD-10-CM | POA: Diagnosis present

## 2022-12-26 DIAGNOSIS — Z955 Presence of coronary angioplasty implant and graft: Secondary | ICD-10-CM

## 2022-12-26 DIAGNOSIS — Z880 Allergy status to penicillin: Secondary | ICD-10-CM

## 2022-12-26 DIAGNOSIS — Z9181 History of falling: Secondary | ICD-10-CM

## 2022-12-26 DIAGNOSIS — R29705 NIHSS score 5: Secondary | ICD-10-CM | POA: Diagnosis not present

## 2022-12-26 DIAGNOSIS — G8929 Other chronic pain: Secondary | ICD-10-CM | POA: Diagnosis present

## 2022-12-26 DIAGNOSIS — I482 Chronic atrial fibrillation, unspecified: Secondary | ICD-10-CM | POA: Diagnosis present

## 2022-12-26 DIAGNOSIS — I4510 Unspecified right bundle-branch block: Secondary | ICD-10-CM | POA: Diagnosis present

## 2022-12-26 DIAGNOSIS — M1711 Unilateral primary osteoarthritis, right knee: Secondary | ICD-10-CM | POA: Diagnosis present

## 2022-12-26 DIAGNOSIS — M87251 Osteonecrosis due to previous trauma, right femur: Secondary | ICD-10-CM | POA: Diagnosis present

## 2022-12-26 DIAGNOSIS — E039 Hypothyroidism, unspecified: Secondary | ICD-10-CM | POA: Diagnosis not present

## 2022-12-26 DIAGNOSIS — Z809 Family history of malignant neoplasm, unspecified: Secondary | ICD-10-CM

## 2022-12-26 DIAGNOSIS — Z7901 Long term (current) use of anticoagulants: Secondary | ICD-10-CM

## 2022-12-26 DIAGNOSIS — R0902 Hypoxemia: Secondary | ICD-10-CM | POA: Diagnosis present

## 2022-12-26 DIAGNOSIS — Z79890 Hormone replacement therapy (postmenopausal): Secondary | ICD-10-CM

## 2022-12-26 DIAGNOSIS — F0393 Unspecified dementia, unspecified severity, with mood disturbance: Secondary | ICD-10-CM | POA: Diagnosis not present

## 2022-12-26 DIAGNOSIS — Z833 Family history of diabetes mellitus: Secondary | ICD-10-CM

## 2022-12-26 DIAGNOSIS — Z79899 Other long term (current) drug therapy: Secondary | ICD-10-CM

## 2022-12-26 DIAGNOSIS — I10 Essential (primary) hypertension: Secondary | ICD-10-CM | POA: Diagnosis present

## 2022-12-26 DIAGNOSIS — I6523 Occlusion and stenosis of bilateral carotid arteries: Secondary | ICD-10-CM | POA: Diagnosis present

## 2022-12-26 DIAGNOSIS — E785 Hyperlipidemia, unspecified: Secondary | ICD-10-CM | POA: Diagnosis present

## 2022-12-26 DIAGNOSIS — H9193 Unspecified hearing loss, bilateral: Secondary | ICD-10-CM | POA: Diagnosis present

## 2022-12-26 LAB — PROTIME-INR
INR: 1.8 — ABNORMAL HIGH (ref 0.8–1.2)
Prothrombin Time: 21.3 s — ABNORMAL HIGH (ref 11.4–15.2)

## 2022-12-26 LAB — COMPREHENSIVE METABOLIC PANEL
ALT: 15 U/L (ref 0–44)
AST: 30 U/L (ref 15–41)
Albumin: 3.3 g/dL — ABNORMAL LOW (ref 3.5–5.0)
Alkaline Phosphatase: 73 U/L (ref 38–126)
Anion gap: 8 (ref 5–15)
BUN: 15 mg/dL (ref 8–23)
CO2: 23 mmol/L (ref 22–32)
Calcium: 8.5 mg/dL — ABNORMAL LOW (ref 8.9–10.3)
Chloride: 101 mmol/L (ref 98–111)
Creatinine, Ser: 0.67 mg/dL (ref 0.61–1.24)
GFR, Estimated: >60 mL/min (ref >60)
Glucose, Bld: 93 mg/dL (ref 70–99)
Potassium: 3.8 mmol/L (ref 3.5–5.1)
Sodium: 132 mmol/L — ABNORMAL LOW (ref 135–145)
Total Bilirubin: 1.1 mg/dL (ref <1.2)
Total Protein: 6 g/dL — ABNORMAL LOW (ref 6.5–8.1)

## 2022-12-26 LAB — URINALYSIS, ROUTINE W REFLEX MICROSCOPIC
Bilirubin Urine: NEGATIVE
Glucose, UA: NEGATIVE mg/dL
Hgb urine dipstick: NEGATIVE
Ketones, ur: NEGATIVE mg/dL
Leukocytes,Ua: NEGATIVE
Nitrite: NEGATIVE
Protein, ur: NEGATIVE mg/dL
Specific Gravity, Urine: 1.023 (ref 1.005–1.030)
pH: 5 (ref 5.0–8.0)

## 2022-12-26 LAB — CBC
HCT: 35.5 % — ABNORMAL LOW (ref 39.0–52.0)
Hemoglobin: 12 g/dL — ABNORMAL LOW (ref 13.0–17.0)
MCH: 35.1 pg — ABNORMAL HIGH (ref 26.0–34.0)
MCHC: 33.8 g/dL (ref 30.0–36.0)
MCV: 103.8 fL — ABNORMAL HIGH (ref 80.0–100.0)
Platelets: 175 10*3/uL (ref 150–400)
RBC: 3.42 MIL/uL — ABNORMAL LOW (ref 4.22–5.81)
RDW: 12.7 % (ref 11.5–15.5)
WBC: 5.3 10*3/uL (ref 4.0–10.5)
nRBC: 0 % (ref 0.0–0.2)

## 2022-12-26 LAB — LIPID PANEL
Cholesterol: 133 mg/dL (ref 0–200)
HDL: 49 mg/dL (ref >40)
LDL Cholesterol: 74 mg/dL (ref 0–99)
Total CHOL/HDL Ratio: 2.7 {ratio}
Triglycerides: 49 mg/dL (ref <150)
VLDL: 10 mg/dL (ref 0–40)

## 2022-12-26 LAB — TROPONIN I (HIGH SENSITIVITY)
Troponin I (High Sensitivity): 19 ng/L — ABNORMAL HIGH (ref <18)
Troponin I (High Sensitivity): 21 ng/L — ABNORMAL HIGH (ref <18)

## 2022-12-26 LAB — APTT: aPTT: 43 s — ABNORMAL HIGH (ref 24–36)

## 2022-12-26 MED ORDER — ACETAMINOPHEN 325 MG PO TABS
650.0000 mg | ORAL_TABLET | Freq: Four times a day (QID) | ORAL | Status: DC | PRN
  Administered 2022-12-29: 650 mg via ORAL
  Filled 2022-12-26: qty 2

## 2022-12-26 MED ORDER — HYDRALAZINE HCL 20 MG/ML IJ SOLN: 5.0000 mg | INTRAMUSCULAR | Status: DC | PRN

## 2022-12-26 MED ORDER — ENOXAPARIN SODIUM 40 MG/0.4ML IJ SOSY: 40.0000 mg | PREFILLED_SYRINGE | INTRAMUSCULAR | Status: DC

## 2022-12-26 MED ORDER — OXYCODONE HCL 5 MG PO TABS
5.0000 mg | ORAL_TABLET | Freq: Four times a day (QID) | ORAL | Status: DC | PRN
  Administered 2022-12-26 – 2022-12-29 (×5): 5 mg via ORAL
  Filled 2022-12-26 (×5): qty 1

## 2022-12-26 MED ORDER — ONDANSETRON HCL 4 MG/2ML IJ SOLN: 4.0000 mg | Freq: Three times a day (TID) | INTRAMUSCULAR | Status: DC | PRN

## 2022-12-26 MED ORDER — ATORVASTATIN CALCIUM 20 MG PO TABS
40.0000 mg | ORAL_TABLET | Freq: Every day | ORAL | Status: DC
  Administered 2022-12-26 – 2022-12-30 (×5): 40 mg via ORAL
  Filled 2022-12-26 (×5): qty 2

## 2022-12-26 MED ORDER — ASPIRIN 325 MG PO TABS
325.0000 mg | ORAL_TABLET | Freq: Every day | ORAL | Status: DC
  Administered 2022-12-26 – 2022-12-27 (×2): 325 mg via ORAL
  Filled 2022-12-26 (×2): qty 1

## 2022-12-26 MED ORDER — ASPIRIN 300 MG RE SUPP
300.0000 mg | Freq: Every day | RECTAL | Status: DC
  Filled 2022-12-26 (×2): qty 1

## 2022-12-26 MED ORDER — STROKE: EARLY STAGES OF RECOVERY BOOK: Freq: Once | Status: AC

## 2022-12-26 NOTE — H&P (Signed)
History and Physical    Louis Wells WUJ:[id number] DOB: 10-29-1932 DOA: 12/26/2022  Referring MD/NP/PA:   PCP: Louis Reichmann, MD   Patient coming from:  The patient is coming from home.     Chief Complaint: AMS and difficulty walking  HPI: Louis Wells is a 89 y.o. male with medical history significant of HTN, HLD, CAD with stent, hypothyroidism, BPH, chronic right hip pain, A fib on Eliquis, who presents with AMS and difficulty walking. MRI showed punctate acute infarct in the left occipital lobe. Will do stroke work up and temporarily hold Eliquis.     Data reviewed independently and ED Course: pt was found to have     ***       MRI-brain 1. Punctate acute infarct in the left occipital lobe. 2. Significantly motion limited study without evidence of other acute abnormality.     EKG: I have personally reviewed.  Not done in ED, will get one.   ***   Review of Systems:   General: no fevers, chills, no body weight gain, has poor appetite, has fatigue HEENT: no blurry vision, hearing changes or sore throat Respiratory: no dyspnea, coughing, wheezing CV: no chest pain, no palpitations GI: no nausea, vomiting, abdominal pain, diarrhea, constipation GU: no dysuria, burning on urination, increased urinary frequency, hematuria  Ext: no leg edema Neuro: no unilateral weakness, numbness, or tingling, no vision change or hearing loss Skin: no rash, no skin tear. MSK: No muscle spasm, no deformity, no limitation of range of movement in spin Heme: No easy bruising.  Travel history: No recent long distant travel.   Allergy:  Allergies  Allergen Reactions   Contrast Media [Iodinated Contrast Media] Shortness Of Breath   Amoxicillin    Sulfa Antibiotics     Other reaction(s): Unknown Uncoded Allergy. Allergen: IV contrast    Past Medical History:  Diagnosis Date   Arthralgia of ankle or foot 11/20/2014   Atrial fibrillation (HCC)    Degenerative arthritis of  hip 04/05/2015   Hyperlipidemia    Hypertension    Myocardial infarction [facility])    Primary osteoarthritis of right knee 05/22/2015   Thyroid disease     Past Surgical History:  Procedure Laterality Date   CORONARY ANGIOPLASTY WITH STENT PLACEMENT      Social History:  reports that he has quit smoking. He has never used smokeless tobacco. He reports that he does not drink alcohol and does not use drugs.  Family History:  Family History  Problem Relation Age of Onset   Cancer Mother    Diabetes Father      Prior to Admission medications   Medication Sig Start Date End Date Taking? Authorizing Provider  apixaban (ELIQUIS) 5 MG TABS tablet Take 5 mg by mouth 2 (two) times daily.  10/22/15   [provider]  levothyroxine (SYNTHROID) 100 MCG tablet Take 100 mcg by mouth every morning. 08/28/20   [provider]  LINZESS 72 MCG capsule Take 72 mcg by mouth daily. 08/04/20   [provider]  nitroGLYCERIN (NITROSTAT) 0.4 MG SL tablet Place under the tongue.    [provider]  pantoprazole (PROTONIX) 40 MG tablet Take 40 mg by mouth daily. 08/07/20   [provider]    Physical Exam: Vitals:   12/26/22 1700 12/26/22 1730 12/26/22 1800 12/26/22 1945  BP: (!) 150/123 134/89 135/74   Pulse: 82 84 95   Resp: 17 (!) 24 17   Temp:  TempSrc:      SpO2: 96% 96% 96%   Weight:    79.4 kg   General: Not in acute distress HEENT:       Eyes: PERRL, EOMI, no jaundice       ENT: No discharge from the ears and nose, no pharynx injection, no tonsillar enlargement.        Neck: No JVD, no bruit, no mass felt. Heme: No neck lymph node enlargement. Cardiac: S1/S2, RRR, No murmurs, No gallops or rubs. Respiratory: No rales, wheezing, rhonchi or rubs. GI: Soft, nondistended, nontender, no rebound pain, no organomegaly, BS present. GU: No hematuria Ext: No pitting leg edema bilaterally. 1+DP/PT pulse bilaterally. Musculoskeletal: No joint deformities,  No joint redness or warmth, no limitation of ROM in spin. Skin: No rashes.  Neuro: Alert, oriented X3, cranial nerves II-XII grossly intact, moves all extremities normally. Muscle strength 5/5 in all extremities, sensation to light touch intact. Brachial reflex 2+ bilaterally. Knee reflex 1+ bilaterally. Negative Babinski's sign. Normal finger to nose test. Psych: Patient is not psychotic, no suicidal or hemocidal ideation.  Labs on Admission: I have personally reviewed following labs and imaging studies  CBC: Recent Labs  Lab 12/26/22 1221  WBC 5.3  HGB 12.0*  HCT 35.5*  MCV 103.8*  PLT 175   Basic Metabolic Panel: Recent Labs  Lab 12/26/22 1221  NA 132*  K 3.8  CL 101  CO2 23  GLUCOSE 93  BUN 15  CREATININE 0.67  CALCIUM 8.5*   GFR: CrCl cannot be calculated (Unknown ideal weight.). Liver Function Tests: Recent Labs  Lab 12/26/22 1221  AST 30  ALT 15  ALKPHOS 73  BILITOT 1.1  PROT 6.0*  ALBUMIN 3.3*   No results for input(s): "LIPASE", "AMYLASE" in the last 168 hours. No results for input(s): "AMMONIA" in the last 168 hours. Coagulation Profile: No results for input(s): "INR", "PROTIME" in the last 168 hours. Cardiac Enzymes: No results for input(s): "CKTOTAL", "CKMB", "CKMBINDEX", "TROPONINI" in the last 168 hours. BNP (last 3 results) No results for input(s): "PROBNP" in the last 8760 hours. HbA1C: No results for input(s): "HGBA1C" in the last 72 hours. CBG: No results for input(s): "GLUCAP" in the last 168 hours. Lipid Profile: Recent Labs    12/26/22 1221  CHOL 133  HDL 49  LDLCALC 74  TRIG 49  CHOLHDL 2.7   Thyroid Function Tests: No results for input(s): "TSH", "T4TOTAL", "FREET4", "T3FREE", "THYROIDAB" in the last 72 hours. Anemia Panel: No results for input(s): "VITAMINB12", "FOLATE", "FERRITIN", "TIBC", "IRON", "RETICCTPCT" in the last 72 hours. Urine analysis:    Component Value Date/Time   COLORURINE AMBER (A) 12/26/2022 1653    APPEARANCEUR CLEAR (A) 12/26/2022 1653   LABSPEC 1.023 12/26/2022 1653   PHURINE 5.0 12/26/2022 1653   GLUCOSEU NEGATIVE 12/26/2022 1653   HGBUR NEGATIVE 12/26/2022 1653   BILIRUBINUR NEGATIVE 12/26/2022 1653   KETONESUR NEGATIVE 12/26/2022 1653   PROTEINUR NEGATIVE 12/26/2022 1653   NITRITE NEGATIVE 12/26/2022 1653   LEUKOCYTESUR NEGATIVE 12/26/2022 1653   Sepsis Labs: @LABRCNTIP (procalcitonin:4,lacticidven:4) )No results found for this or any previous visit (from the past 240 hour(s)).   Radiological Exams on Admission: MR BRAIN WO CONTRAST  Result Date: 12/26/2022 EXAM: MRI HEAD WITHOUT CONTRAST TECHNIQUE: Multiplanar, multiecho pulse sequences of the brain and surrounding structures were obtained without intravenous contrast. COMPARISON:  CT head from today. FINDINGS: Significantly motion limited study.  Within this limitation: Brain: Punctate acute infarct in the left occipital lobe (series 9, image  68). No evidence of acute hemorrhage, mass lesion, midline shift or hydrocephalus. Vascular: No well evaluated due to motion. Skull and upper cervical spine: Normal marrow signal. Sinuses/Orbits: Clear sinuses.  No acute orbital findings. Other: No mastoid effusions. IMPRESSION: 1. Punctate acute infarct in the left occipital lobe. 2. Significantly motion limited study without evidence of other acute abnormality. Electronically Signed   By: Feliberto Harts M.D.   On: 12/26/2022 19:53   CT Head Wo Contrast  Result Date: 12/26/2022 CLINICAL DATA:  Mental status change, unknown cause. Intermittent altered mental status for 2-3 days. EXAM: CT HEAD WITHOUT CONTRAST TECHNIQUE: Contiguous axial images were obtained from the base of the skull through the vertex without intravenous contrast. RADIATION DOSE REDUCTION: This exam was performed according to the departmental dose-optimization program which includes automated exposure control, adjustment of the mA and/or kV according to patient size  and/or use of iterative reconstruction technique. COMPARISON:  None Available. FINDINGS: Brain: There is no evidence of an acute infarct, intracranial hemorrhage, mass, midline shift, or extra-axial fluid collection. There is a small chronic infarct inferiorly in the right cerebellar hemisphere. Mild cerebral atrophy is within normal limits for age. Hypodensities in the cerebral white matter nonspecific but compatible with mild chronic small vessel ischemic disease. Vascular: Calcified atherosclerosis at the skull base. No hyperdense vessel. Skull: No acute fracture or suspicious osseous lesion. Sinuses/Orbits: Mild mucosal thickening in the paranasal sinuses. Trace left mastoid fluid. Unremarkable orbits. Other: None. IMPRESSION: 1. No evidence of acute intracranial abnormality. 2. Small chronic right cerebellar infarct. 3. Mild chronic small vessel ischemic disease. Electronically Signed   By: Sebastian Ache M.D.   On: 12/26/2022 13:11      Assessment/Plan Principal Problem:   Stroke [facility]) Active Problems:   Acute metabolic encephalopathy   CAD (coronary artery disease)   Myocardial injury   Hypothyroidism   HTN (hypertension)   Oxygen desaturation   Pain Hip (Right)   Assessment and Plan: No notes have been filed under this hospital service. Service: Hospitalist      Principal Problem:   Stroke [facility]) Active Problems:   Acute metabolic encephalopathy   CAD (coronary artery disease)   Myocardial injury   Hypothyroidism   HTN (hypertension)   Oxygen desaturation   Pain Hip (Right)    DVT ppx: SQ Heparin         SQ Lovenox  Code Status: Full code   ***  Family Communication: not done, no family member is at bed side.              Yes, patient's    at bed side.       by phone  Disposition Plan:  Anticipate discharge back to previous environment  Consults called:    Admission status and Level of care: Telemetry Medical:    Med-surg bed for obs as inpt    progressive unit  for obs   as inpt      SDU/inpation        {Inpatient:23812}  Dispo: The patient is from: {From:23814}              Anticipated d/c is to: {To:23815}              Anticipated d/c date is: {Days:23816}              Patient currently {Medically stable:23817}    Severity of Illness:  {Observation/Inpatient:21159}       Date of Service 12/26/2022    Lorretta Harp [facility]   If 7PM-7AM, please contact night-coverage [email or web address] 12/26/2022, 8:21 PM

## 2022-12-26 NOTE — ED Triage Notes (Addendum)
Pt is alert, oriented to self and situation only. Pt is hard of hearing. Respirations even and unlabored. Upper and lower extremities strong bilaterally, speech is clear, face appears symmetric. Pt c/o chronic hip pain only. Per family, pt has been having increasing hallucinations and confusion. Pt has history of alzheimer's.

## 2022-12-26 NOTE — ED Notes (Signed)
Pt transported per

## 2022-12-26 NOTE — ED Provider Notes (Signed)
[facility] Provider Note    Event Date/Time   First MD Initiated Contact with Patient 12/26/22 1613     (approximate)   History   Altered Mental Status   HPI  Louis Wells is a 89 y.o. male with a history of hypertension, hyperlipidemia, atrial fibrillation, CAD status post MI, and osteoarthritis who presents with altered mental status and weakness.  Per the family members, the patient has chronic right hip arthritis and pain and over the last several weeks has not been able to walk.  He does not want to get surgery for this.  Over proximal last 2 to 3 weeks, the patient has had increased confusion and hallucinations especially at night but sometimes occurring during the day.  His mental status waxes and wanes.  Over the last 2 days he has become increasingly confused including during the day.  The patient himself denies any acute pain, dizziness, shortness of breath, or weakness.  I reviewed the past medical records.  The patient's most recent outpatient encounters were with internal medicine on 10/21 for groin pain and weakness, and with orthopedics on 11/6 due to chronic right hip pain.   Physical Exam   Triage Vital Signs: ED Triage Vitals  Encounter Vitals Group     BP 12/26/22 1215 (!) 141/76     Systolic BP Percentile --      Diastolic BP Percentile --      Pulse Rate 12/26/22 1215 86     Resp 12/26/22 1215 18     Temp 12/26/22 1215 98.3 F (36.8 C)     Temp Source 12/26/22 1215 Oral     SpO2 12/26/22 1215 97 %     Weight --      Height --      Head Circumference --      Peak Flow --      Pain Score 12/26/22 1218 5     Pain Loc --      Pain Education --      Exclude from Growth Chart --     Most recent vital signs: Vitals:   12/26/22 1730 12/26/22 1800  BP: 134/89 135/74  Pulse: 84 95  Resp: (!) 24 17  Temp:    SpO2: 96% 96%     General: Alert, oriented x 2, no distress.  CV:  Good peripheral perfusion.  Resp:  Normal  effort.  Abd:  No distention.  Other:  EOMI.  PERRLA.  Normal speech.  No facial droop.  Motor intact in all extremities.   ED Results / Procedures / Treatments   Labs (all labs ordered are listed, but only abnormal results are displayed) Labs Reviewed  COMPREHENSIVE METABOLIC PANEL - Abnormal; Notable for the following components:      Result Value   Sodium 132 (*)    Calcium 8.5 (*)    Total Protein 6.0 (*)    Albumin 3.3 (*)    All other components within normal limits  CBC - Abnormal; Notable for the following components:   RBC 3.42 (*)    Hemoglobin 12.0 (*)    HCT 35.5 (*)    MCV 103.8 (*)    MCH 35.1 (*)    All other components within normal limits  URINALYSIS, ROUTINE W REFLEX MICROSCOPIC - Abnormal; Notable for the following components:   Color, Urine AMBER (*)    APPearance CLEAR (*)    All other components within normal limits  TROPONIN I (HIGH SENSITIVITY) -  Abnormal; Notable for the following components:   Troponin I (High Sensitivity) 19 (*)    All other components within normal limits  TROPONIN I (HIGH SENSITIVITY) - Abnormal; Notable for the following components:   Troponin I (High Sensitivity) 21 (*)    All other components within normal limits  HEMOGLOBIN A1C  LIPID PANEL     EKG  ED ECG REPORT I, Dionne Bucy, the attending physician, personally viewed and interpreted this ECG.  Date: 12/26/2022 EKG Time: 1230 Rate: 79 Rhythm: Atrial fibrillation QRS Axis: normal Intervals: RBBB ST/T Wave abnormalities: Nonspecific T wave abnormalities Narrative Interpretation: no evidence of acute ischemia    RADIOLOGY  CT head: I independently viewed and interpreted the images; there is no ICH.  Radiology report indicates no acute abnormality.   PROCEDURES:  Critical Care performed: No  Procedures   MEDICATIONS ORDERED IN ED: Medications  ondansetron (ZOFRAN) injection 4 mg (has no administration in time range)  hydrALAZINE (APRESOLINE)  injection 5 mg (has no administration in time range)  acetaminophen (TYLENOL) tablet 650 mg (has no administration in time range)     IMPRESSION / MDM / ASSESSMENT AND PLAN / ED COURSE  I reviewed the triage vital signs and the nursing notes.  89 year old male with PMH as noted above presents with altered mental status, with increasing confusion and weakness over the last several weeks and then a rapid worsening in the last few days.  Vital signs are normal.  Neurologic exam is nonfocal.  Differential diagnosis includes, but is not limited to, dementia, other chronic cognitive impairment, UTI, other infection, electrolyte abnormality, uremia, other metabolic cause, less likely stroke or other acute CNS etiology.  CT head is negative for acute findings.  CBC shows no leukocytosis or anemia.  CMP shows normal electrolytes.  Troponin is minimally elevated.  We will obtain a urinalysis and reassess.  Patient's presentation is most consistent with acute presentation with potential threat to life or bodily function.  The patient is on the cardiac monitor to evaluate for evidence of arrhythmia and/or significant heart rate changes.  ----------------------------------------- 7:14 PM on 12/26/2022 -----------------------------------------  Urinalysis is negative.  Overall suspect most likely worsening dementia.  However, I have discussed the patient's clinical status with family, and given the patient's acutely worsened mental status and delirium over the last couple days he will need admission for further workup and management.  I have ordered an MRI of the brain.  I consulted Dr. Cecille Aver the hospitalist service; based on our discussion he agrees to evaluate the patient for admission.  FINAL CLINICAL IMPRESSION(S) / ED DIAGNOSES   Final diagnoses:  Delirium     Rx / DC Orders   ED Discharge Orders     None        Note:  This document was prepared using Dragon voice recognition  software and may include unintentional dictation errors.    Dionne Bucy, MD 12/26/22 [phone]

## 2022-12-26 NOTE — ED Triage Notes (Addendum)
Arrives from home via [facility]  Family c/o intermittent AMS x 2-3 days. EMS reports AAOx3. Skin warm and dry.  VS wnl.  History of Afib.  Known right hip fracture x 3 weeks.

## 2022-12-26 NOTE — ED Notes (Signed)
Pt transported to hospital room per RN on monitor CCMD notified pt baseline neuro unchanged admitting physician notified pt request home pain relief medication

## 2022-12-26 NOTE — ED Provider Triage Note (Signed)
Emergency Medicine Provider Triage Evaluation Note  Louis Wells , a 89 y.o. male  was evaluated in triage.  Pt complains of AMS. Daughter reports that he has been hallucinating. He was recently started on some sleeping medicine. He does have a history of dementia but this has been acutely worse than baseline. Not sleeping at night, up talking and singing.   Patient was unable to walk, seen by ortho who found a collapsed hip.  Review of Systems  Positive: Hallucinations, confusion Negative: Chest pain  Physical Exam  There were no vitals taken for this visit. Gen:   Awake, no distress   Resp:  Normal effort  MSK:   Moves extremities without difficulty  Other:    Medical Decision Making  Medically screening exam initiated at 12:17 PM.  Appropriate orders placed.  Louis Wells was informed that the remainder of the evaluation will be completed by another provider, this initial triage assessment does not replace that evaluation, and the importance of remaining in the ED until their evaluation is complete.     Cameron Ali, PA-C 12/26/22 1224

## 2022-12-27 ENCOUNTER — Inpatient Hospital Stay
Admit: 2022-12-27 | Discharge: 2022-12-27 | Disposition: A | Discharge status: Home / Self Care | Payer: Medicare Other | Attending: Internal Medicine | Admitting: Internal Medicine

## 2022-12-27 ENCOUNTER — Observation Stay: Payer: Medicare Other

## 2022-12-27 DIAGNOSIS — I639 Cerebral infarction, unspecified: Secondary | ICD-10-CM | POA: Diagnosis present

## 2022-12-27 DIAGNOSIS — E039 Hypothyroidism, unspecified: Secondary | ICD-10-CM | POA: Diagnosis present

## 2022-12-27 DIAGNOSIS — G8929 Other chronic pain: Secondary | ICD-10-CM | POA: Diagnosis present

## 2022-12-27 DIAGNOSIS — F0393 Unspecified dementia, unspecified severity, with mood disturbance: Secondary | ICD-10-CM | POA: Diagnosis not present

## 2022-12-27 DIAGNOSIS — I6523 Occlusion and stenosis of bilateral carotid arteries: Secondary | ICD-10-CM | POA: Diagnosis present

## 2022-12-27 DIAGNOSIS — R410 Disorientation, unspecified: Secondary | ICD-10-CM | POA: Diagnosis present

## 2022-12-27 DIAGNOSIS — I10 Essential (primary) hypertension: Secondary | ICD-10-CM | POA: Diagnosis present

## 2022-12-27 DIAGNOSIS — I6389 Other cerebral infarction: Secondary | ICD-10-CM | POA: Diagnosis present

## 2022-12-27 DIAGNOSIS — G9341 Metabolic encephalopathy: Secondary | ICD-10-CM | POA: Diagnosis present

## 2022-12-27 DIAGNOSIS — R29705 NIHSS score 5: Secondary | ICD-10-CM | POA: Diagnosis not present

## 2022-12-27 DIAGNOSIS — M1711 Unilateral primary osteoarthritis, right knee: Secondary | ICD-10-CM | POA: Diagnosis present

## 2022-12-27 DIAGNOSIS — M87251 Osteonecrosis due to previous trauma, right femur: Secondary | ICD-10-CM | POA: Diagnosis present

## 2022-12-27 DIAGNOSIS — K589 Irritable bowel syndrome without diarrhea: Secondary | ICD-10-CM | POA: Diagnosis present

## 2022-12-27 DIAGNOSIS — N400 Enlarged prostate without lower urinary tract symptoms: Secondary | ICD-10-CM | POA: Diagnosis present

## 2022-12-27 DIAGNOSIS — F03918 Unspecified dementia, unspecified severity, with other behavioral disturbance: Secondary | ICD-10-CM | POA: Diagnosis not present

## 2022-12-27 DIAGNOSIS — I482 Chronic atrial fibrillation, unspecified: Secondary | ICD-10-CM | POA: Diagnosis present

## 2022-12-27 DIAGNOSIS — I4510 Unspecified right bundle-branch block: Secondary | ICD-10-CM | POA: Diagnosis present

## 2022-12-27 DIAGNOSIS — F0392 Unspecified dementia, unspecified severity, with psychotic disturbance: Secondary | ICD-10-CM | POA: Diagnosis present

## 2022-12-27 DIAGNOSIS — Z66 Do not resuscitate: Secondary | ICD-10-CM | POA: Diagnosis not present

## 2022-12-27 DIAGNOSIS — Z515 Encounter for palliative care: Secondary | ICD-10-CM | POA: Diagnosis not present

## 2022-12-27 DIAGNOSIS — H9193 Unspecified hearing loss, bilateral: Secondary | ICD-10-CM | POA: Diagnosis present

## 2022-12-27 DIAGNOSIS — M1611 Unilateral primary osteoarthritis, right hip: Secondary | ICD-10-CM | POA: Diagnosis present

## 2022-12-27 DIAGNOSIS — E785 Hyperlipidemia, unspecified: Secondary | ICD-10-CM | POA: Diagnosis present

## 2022-12-27 DIAGNOSIS — I2510 Atherosclerotic heart disease of native coronary artery without angina pectoris: Secondary | ICD-10-CM | POA: Diagnosis present

## 2022-12-27 DIAGNOSIS — M25551 Pain in right hip: Secondary | ICD-10-CM | POA: Diagnosis present

## 2022-12-27 DIAGNOSIS — G9349 Other encephalopathy: Secondary | ICD-10-CM | POA: Diagnosis present

## 2022-12-27 DIAGNOSIS — I5A Non-ischemic myocardial injury (non-traumatic): Secondary | ICD-10-CM | POA: Diagnosis present

## 2022-12-27 LAB — BASIC METABOLIC PANEL
Anion gap: 9 (ref 5–15)
BUN: 13 mg/dL (ref 8–23)
CO2: 25 mmol/L (ref 22–32)
Calcium: 8.6 mg/dL — ABNORMAL LOW (ref 8.9–10.3)
Chloride: 99 mmol/L (ref 98–111)
Creatinine, Ser: 0.56 mg/dL — ABNORMAL LOW (ref 0.61–1.24)
GFR, Estimated: >60 mL/min (ref >60)
Glucose, Bld: 86 mg/dL (ref 70–99)
Potassium: 3.9 mmol/L (ref 3.5–5.1)
Sodium: 133 mmol/L — ABNORMAL LOW (ref 135–145)

## 2022-12-27 LAB — CBC
HCT: 33.6 % — ABNORMAL LOW (ref 39.0–52.0)
Hemoglobin: 11.8 g/dL — ABNORMAL LOW (ref 13.0–17.0)
MCH: 35.1 pg — ABNORMAL HIGH (ref 26.0–34.0)
MCHC: 35.1 g/dL (ref 30.0–36.0)
MCV: 100 fL (ref 80.0–100.0)
Platelets: 203 10*3/uL (ref 150–400)
RBC: 3.36 MIL/uL — ABNORMAL LOW (ref 4.22–5.81)
RDW: 12.6 % (ref 11.5–15.5)
WBC: 6.1 10*3/uL (ref 4.0–10.5)
nRBC: 0 % (ref 0.0–0.2)

## 2022-12-27 LAB — HEMOGLOBIN A1C
Hgb A1c MFr Bld: 5 % (ref 4.8–5.6)
Mean Plasma Glucose: 96.8 mg/dL

## 2022-12-27 MED ORDER — ENOXAPARIN SODIUM 40 MG/0.4ML IJ SOSY
40.0000 mg | PREFILLED_SYRINGE | INTRAMUSCULAR | Status: DC
  Administered 2022-12-27: 40 mg via SUBCUTANEOUS
  Filled 2022-12-27: qty 0.4

## 2022-12-27 MED ORDER — QUETIAPINE FUMARATE 25 MG PO TABS
50.0000 mg | ORAL_TABLET | Freq: Every day | ORAL | Status: DC
  Administered 2022-12-27 – 2022-12-29 (×3): 50 mg via ORAL
  Filled 2022-12-27 (×3): qty 2

## 2022-12-27 MED ORDER — APIXABAN 5 MG PO TABS
5.0000 mg | ORAL_TABLET | Freq: Two times a day (BID) | ORAL | Status: DC
  Administered 2022-12-27 – 2022-12-30 (×6): 5 mg via ORAL
  Filled 2022-12-27 (×6): qty 1

## 2022-12-27 MED ORDER — LINACLOTIDE 72 MCG PO CAPS
72.0000 ug | ORAL_CAPSULE | Freq: Every day | ORAL | Status: DC
  Administered 2022-12-27 – 2022-12-30 (×4): 72 ug via ORAL
  Filled 2022-12-27 (×5): qty 1

## 2022-12-27 MED ORDER — HYDRALAZINE HCL 20 MG/ML IJ SOLN: 5.0000 mg | INTRAMUSCULAR | Status: DC | PRN

## 2022-12-27 MED ORDER — LEVOTHYROXINE SODIUM 100 MCG PO TABS
100.0000 ug | ORAL_TABLET | Freq: Every day | ORAL | Status: DC
  Administered 2022-12-27 – 2022-12-30 (×4): 100 ug via ORAL
  Filled 2022-12-27 (×4): qty 1

## 2022-12-27 MED ORDER — PANTOPRAZOLE SODIUM 40 MG PO TBEC
40.0000 mg | DELAYED_RELEASE_TABLET | Freq: Every day | ORAL | Status: DC
  Administered 2022-12-27 – 2022-12-30 (×4): 40 mg via ORAL
  Filled 2022-12-27 (×4): qty 1

## 2022-12-27 MED ORDER — NITROGLYCERIN 0.4 MG SL SUBL: 0.4000 mg | SUBLINGUAL_TABLET | SUBLINGUAL | Status: DC | PRN

## 2022-12-27 NOTE — Progress Notes (Signed)
PROGRESS NOTE    Louis Wells  UEA:[id number] DOB: [birth year] DOA: 12/26/2022 PCP: Barbette Reichmann, MD    Brief Narrative:   89 y.o. male with medical history significant of HTN, HLD, CAD with stent, hypothyroidism, BPH, chronic right hip pain, A fib on Eliquis, who presents with AMS and difficulty walking.    Per patient's 2 daughters at bedside, patient is intermittently more confused than baseline in the past 3 days.  At normal baseline, patient recognizes family members, sometimes orientated to place, usually not orientated to the time.  When I saw patient in ED, patient is confused, knows his own name, not orientated to place and time.  Patient has weakness in both legs, cannot walk as normal he does.  No facial droop or slurred speech.  Patient does not have chest pain, SOB, cough.  Sometimes he has wheezing.  No nausea, vomiting, diarrhea or abdominal pain.  No symptoms of UTI.  Assessment & Plan:   Principal Problem:   Stroke [facility]) Active Problems:   Acute metabolic encephalopathy   CAD (coronary artery disease)   Myocardial injury   Hypothyroidism   HTN (hypertension)   Pain Hip (Right)   Atrial fibrillation, chronic (HCC)  Stroke [facility]): MRI showed punctate acute infarct in the left occipital lobe.  Neurology consulted Plan: Hold eliquis for now ASA Statin 2D echo PT/OT/SLP Fasting lipids HbA1c Neurology consult  Acute metabolic encephalopathy due to stroke -Frequent neurocheck   CAD (coronary artery disease) and myocardial injury: trop is 19 --> 21 -on ASA and lipitor -f/u 2d echo   Hypothyroidism -Synthroid   HTN (hypertension) 3 days symptoms.  Out of window for permissive hypertension.  Normotensive blood pressure goals.   Pain Hip (Right): this is chronic issue. -As needed oxycodone and Tylenol   Chronic A-fib: Heart rate 90s Eliquis on hold.  Heart rate currently controlled.  IBS Home Lin[place]    DVT prophylaxis: Lovenox Code Status:  Full Family Communication: Spouse and 2 daughters at bedside 11/16 Disposition Plan: Status is: Observation The patient will require care spanning > 2 midnights and should be moved to inpatient because: Post CVA management   Level of care: Telemetry Medical  Consultants:  Neurology- Bhagat  Procedures:  None  Antimicrobials: None    Subjective: Seen and examined peer resting in bed.  No visible distress.  No complaints of pain.  Spouse and 2 daughters at bedside.  Objective: Vitals:   12/26/22 2045 12/26/22 2109 12/27/22 0448 12/27/22 1119  BP:  (!) 150/90 135/85 (!) 140/79  Pulse: 70 77 95 83  Resp: 18 20 18 16   Temp:  98.4 F (36.9 C) 98.4 F (36.9 C) 98.1 F (36.7 C)  TempSrc:  Oral Oral   SpO2: 94% 97% 97% 96%  Weight:  79 kg    Height:  5\' 6"  (1.676 m)      Intake/Output Summary (Last 24 hours) at 12/27/2022 1121 Last data filed at 12/26/2022 2117 Gross per 24 hour  Intake 240 ml  Output --  Net 240 ml   Filed Weights   12/26/22 1945 12/26/22 2109  Weight: 79.4 kg 79 kg    Examination:  General exam: Appears calm and comfortable  Respiratory system: Clear to auscultation. Respiratory effort normal. Cardiovascular system: S1-S2, regular rate, irregular rhythm, no murmurs, no pedal edema Gastrointestinal system: Soft, NT/ND, normal bowel sounds Central nervous system: Alert and oriented. No focal neurological deficits. Extremities: Symmetric 5 x 5 power.  Gait not assessed Skin: No rashes,  lesions or ulcers Psychiatry: Judgement and insight appear normal. Mood & affect appropriate.     Data Reviewed: I have personally reviewed following labs and imaging studies  CBC: Recent Labs  Lab 12/26/22 1221 12/27/22 0847  WBC 5.3 6.1  HGB 12.0* 11.8*  HCT 35.5* 33.6*  MCV 103.8* 100.0  PLT 175 203   Basic Metabolic Panel: Recent Labs  Lab 12/26/22 1221 12/27/22 0847  NA 132* 133*  K 3.8 3.9  CL 101 99  CO2 23 25  GLUCOSE 93 86  BUN 15 13   CREATININE 0.67 0.56*  CALCIUM 8.5* 8.6*   GFR: Estimated Creatinine Clearance: 63.1 mL/min (A) (by C-G formula based on SCr of 0.56 mg/dL (L)). Liver Function Tests: Recent Labs  Lab 12/26/22 1221  AST 30  ALT 15  ALKPHOS 73  BILITOT 1.1  PROT 6.0*  ALBUMIN 3.3*   No results for input(s): "LIPASE", "AMYLASE" in the last 168 hours. No results for input(s): "AMMONIA" in the last 168 hours. Coagulation Profile: Recent Labs  Lab 12/26/22 2100  INR 1.8*   Cardiac Enzymes: No results for input(s): "CKTOTAL", "CKMB", "CKMBINDEX", "TROPONINI" in the last 168 hours. BNP (last 3 results) No results for input(s): "PROBNP" in the last 8760 hours. HbA1C: Recent Labs    12/26/22 1221  HGBA1C 5.0   CBG: No results for input(s): "GLUCAP" in the last 168 hours. Lipid Profile: Recent Labs    12/26/22 1221  CHOL 133  HDL 49  LDLCALC 74  TRIG 49  CHOLHDL 2.7   Thyroid Function Tests: No results for input(s): "TSH", "T4TOTAL", "FREET4", "T3FREE", "THYROIDAB" in the last 72 hours. Anemia Panel: No results for input(s): "VITAMINB12", "FOLATE", "FERRITIN", "TIBC", "IRON", "RETICCTPCT" in the last 72 hours. Sepsis Labs: No results for input(s): "PROCALCITON", "LATICACIDVEN" in the last 168 hours.  No results found for this or any previous visit (from the past 240 hour(s)).       Radiology Studies: MR BRAIN WO CONTRAST  Result Date: 12/26/2022 EXAM: MRI HEAD WITHOUT CONTRAST TECHNIQUE: Multiplanar, multiecho pulse sequences of the brain and surrounding structures were obtained without intravenous contrast. COMPARISON:  CT head from today. FINDINGS: Significantly motion limited study.  Within this limitation: Brain: Punctate acute infarct in the left occipital lobe (series 9, image 68). No evidence of acute hemorrhage, mass lesion, midline shift or hydrocephalus. Vascular: No well evaluated due to motion. Skull and upper cervical spine: Normal marrow signal. Sinuses/Orbits:  Clear sinuses.  No acute orbital findings. Other: No mastoid effusions. IMPRESSION: 1. Punctate acute infarct in the left occipital lobe. 2. Significantly motion limited study without evidence of other acute abnormality. Electronically Signed   By: Feliberto Harts M.D.   On: 12/26/2022 19:53   CT Head Wo Contrast  Result Date: 12/26/2022 CLINICAL DATA:  Mental status change, unknown cause. Intermittent altered mental status for 2-3 days. EXAM: CT HEAD WITHOUT CONTRAST TECHNIQUE: Contiguous axial images were obtained from the base of the skull through the vertex without intravenous contrast. RADIATION DOSE REDUCTION: This exam was performed according to the departmental dose-optimization program which includes automated exposure control, adjustment of the mA and/or kV according to patient size and/or use of iterative reconstruction technique. COMPARISON:  None Available. FINDINGS: Brain: There is no evidence of an acute infarct, intracranial hemorrhage, mass, midline shift, or extra-axial fluid collection. There is a small chronic infarct inferiorly in the right cerebellar hemisphere. Mild cerebral atrophy is within normal limits for age. Hypodensities in the cerebral white matter nonspecific but  compatible with mild chronic small vessel ischemic disease. Vascular: Calcified atherosclerosis at the skull base. No hyperdense vessel. Skull: No acute fracture or suspicious osseous lesion. Sinuses/Orbits: Mild mucosal thickening in the paranasal sinuses. Trace left mastoid fluid. Unremarkable orbits. Other: None. IMPRESSION: 1. No evidence of acute intracranial abnormality. 2. Small chronic right cerebellar infarct. 3. Mild chronic small vessel ischemic disease. Electronically Signed   By: Sebastian Ache M.D.   On: 12/26/2022 13:11        Scheduled Meds:   stroke: early stages of recovery book   Does not apply Once   aspirin  300 mg Rectal Daily   Or   aspirin  325 mg Oral Daily   atorvastatin  40 mg Oral  Daily   enoxaparin (LOVENOX) injection  40 mg Subcutaneous Q24H   levothyroxine  100 mcg Oral Q0600   linaclotide  72 mcg Oral Daily   pantoprazole  40 mg Oral Daily   Continuous Infusions:   LOS: 0 days     Tresa Moore, MD [facility]   If 7PM-7AM, please contact night-coverage  12/27/2022, 11:21 AM

## 2022-12-27 NOTE — Evaluation (Signed)
Physical Therapy Evaluation Patient Details Name: Louis Wells MRN: [id number] DOB: 1932/02/22 Today's Date: 12/27/2022  History of Present Illness  Pt is 89 y.o. male with medical history significant of HTN, HLD, CAD with stent, hypothyroidism, BPH, chronic right hip pain, A fib on Eliquis, who presents with AMS and difficulty walking.   Clinical Impression  Pt received sitting at EOB upon arrival to the room.  Pt has 3 daughters in the room as well throughout the session.  Pt was noted be independent with all mobility 2 weeks prior to hospitalization, but has made significant decline following stroke.  Pt noted to have weakness in the R LE due to the hip complications that he is having as well.  Pt initially declined therapy and mobility, however with encouragement, pt performed bed mobility and stood at bedside x2.  Upon standing, pt placed significant weight throughout the UE's and has trembling of the UE's due to the amount of weight he pushes through them.  Pt also performed seated hip marches at EOB.  Therapist spent significant amount of time discussing with pt and family members about discourse following discharge.  Pt is addiment about returning home, while daughters report they will not be able to have 24/7 care for the pt like he needs.  Pt returned to bed, however was unsafe in doing so, throwing his head back almost hitting it on the railing.  Pt has poor safety awareness as well.  Pt left with all needs met and call bell within reach.          If plan is discharge home, recommend the following: Two people to help with walking and/or transfers;Two people to help with bathing/dressing/bathroom;Assistance with cooking/housework;Direct supervision/assist for medications management;Assist for transportation;Help with stairs or ramp for entrance   Can travel by private vehicle        Equipment Recommendations None recommended by PT  Recommendations for Other Services        Functional Status Assessment Patient has had a recent decline in their functional status and demonstrates the ability to make significant improvements in function in a reasonable and predictable amount of time.     Precautions / Restrictions        Mobility  Bed Mobility               General bed mobility comments: pt upright at EOB upon arrival.    Transfers Overall transfer level: Needs assistance Equipment used: Rolling walker (2 wheels) Transfers: Sit to/from Stand Sit to Stand: +2 physical assistance, Mod assist           General transfer comment: Pt needed +2 assistance on the first attempt, only +1 on the second attempt.    Ambulation/Gait         Gait velocity: decreased     General Gait Details: pt deferred due to pain in the R hip and feeling like he is going to fall.  Stairs            Wheelchair Mobility     Tilt Bed    Modified Rankin (Stroke Patients Only)       Balance Overall balance assessment: Needs assistance Sitting-balance support: Bilateral upper extremity supported, Feet unsupported Sitting balance-Leahy Scale: Poor Sitting balance - Comments: pt tends to lean to the R side which is his most affected side. Postural control: Right lateral lean Standing balance support: Bilateral upper extremity supported, Reliant on assistive device for balance Standing balance-Leahy Scale: Poor Standing balance comment: Pt  unable to stand without AD assistance                             Pertinent Vitals/Pain Pain Assessment Pain Assessment: Faces Faces Pain Scale: No hurt Pain Location: R hip, pt reports no pain when at rest, but any movement it causes significant pain    Home Living Family/patient expects to be discharged to:: Private residence (if they can get assistance) Living Arrangements: Children Available Help at Discharge: Family;Available PRN/intermittently Type of Home: House Home Access: Ramped  entrance       Home Layout: One level Home Equipment: Agricultural consultant (2 wheels);Electric scooter;Lift chair;BSC/3in1      Prior Function Prior Level of Function : History of Falls (last six months)                     Extremity/Trunk Assessment                Communication   Communication Communication: No apparent difficulties  Cognition Arousal: Alert   Overall Cognitive Status: Impaired/Different from baseline Area of Impairment: Orientation, Safety/judgement                                        General Comments      Exercises     Assessment/Plan    PT Assessment Patient needs continued PT services  PT Problem List Decreased strength;Decreased range of motion;Decreased activity tolerance;Decreased balance;Decreased mobility;Decreased cognition;Decreased knowledge of use of DME;Decreased safety awareness;Pain       PT Treatment Interventions DME instruction;Gait training;Functional mobility training;Therapeutic activities;Therapeutic exercise;Balance training;Neuromuscular re-education    PT Goals (Current goals can be found in the Care Plan section)  Acute Rehab PT Goals Patient Stated Goal: to get better PT Goal Formulation: With patient Time For Goal Achievement: 01/10/23 Potential to Achieve Goals: Good    Frequency Min 1X/week     Co-evaluation PT/OT/SLP Co-Evaluation/Treatment: Yes Reason for Co-Treatment: Complexity of the patient's impairments (multi-system involvement);For patient/therapist safety;To address functional/ADL transfers;Necessary to address cognition/behavior during functional activity PT goals addressed during session: Mobility/safety with mobility;Balance;Proper use of DME OT goals addressed during session: ADL's and self-care;Proper use of Adaptive equipment and DME       AM-PAC PT "6 Clicks" Mobility  Outcome Measure Help needed turning from your back to your side while in a flat bed without using  bedrails?: A Lot Help needed moving from lying on your back to sitting on the side of a flat bed without using bedrails?: A Lot Help needed moving to and from a bed to a chair (including a wheelchair)?: A Lot Help needed standing up from a chair using your arms (e.g., wheelchair or bedside chair)?: A Lot Help needed to walk in hospital room?: Total Help needed climbing 3-5 steps with a railing? : Total 6 Click Score: 10    End of Session Equipment Utilized During Treatment: Gait belt Activity Tolerance: Patient limited by pain;Patient limited by fatigue Patient left: in bed Nurse Communication: Mobility status PT Visit Diagnosis: Unsteadiness on feet (R26.81);Other abnormalities of gait and mobility (R26.89);Muscle weakness (generalized) (M62.81);Difficulty in walking, not elsewhere classified (R26.2);Pain Pain - Right/Left: Right Pain - part of body: Hip    Time: 0926-1012 PT Time Calculation (min) (ACUTE ONLY): 46 min   Charges:   PT Evaluation $PT Eval Low Complexity: 1 Low PT Treatments $  Therapeutic Activity: 8-22 mins PT General Charges $$ ACUTE PT VISIT: 1 Visit         Nolon Bussing, PT, DPT Physical Therapist - [facility]  12/27/22, 1:14 PM

## 2022-12-27 NOTE — Evaluation (Signed)
Occupational Therapy Evaluation Patient Details Name: Louis Wells MRN: [id number] DOB: 1932/10/31 Today's Date: 12/27/2022   History of Present Illness Pt is 89 y.o. male with medical history significant of HTN, HLD, CAD with stent, hypothyroidism, BPH, chronic right hip pain, A fib on Eliquis, who presents with AMS and difficulty walking.   Clinical Impression   Patient up sitting at edge of bed.  3 daughters present in room.  Family reported patient last 2 weeks declining significantly and needing much more care from them with mobility as well as in bathing and dressing.  They unable to provide the care there that needs at home.  Upon assessment patient severely limited in bilateral shoulder range of motion because of arthritis.  Weakness in right grip and upper extremities to perform ADLs.  Patient sitting leaning towards the right on edge of bed.  Was able to don and doff right sock at edge of bed but will be max assist for lower body and upper body dressing because of right hip pain and weakness as well as upper extremity especially shoulders.  Max assist with bathing.  Sit and stand at edge of bed modified to assist upon standing using rolling walker relying a lot on upper extremity support as well as tremoring.  And reporting right hip pain.  Discussed in length with patient and family for recommendation going to skilled nursing for rehab.  Patient is adamant about returning home but does not have at this time insight on care he needs.  Patient can benefit from skilled OT services to address decreased balance and functional mobility and ADLs as well as ADL retraining and adaptive equipment.  Patient left with family in bed.    If plan is discharge home, recommend the following:      Functional Status Assessment     Equipment Recommendations       Recommendations for Other Services       Precautions / Restrictions Precautions Precautions: Fall Restrictions Weight Bearing  Restrictions: No      Mobility Bed Mobility               General bed mobility comments: pt upright at EOB upon arrival.    Transfers Overall transfer level: Needs assistance Equipment used: Rolling walker (2 wheels) Transfers: Sit to/from Stand Sit to Stand: +2 physical assistance, Mod assist           General transfer comment: Pt needed +2 assistance on the first attempt, only +1 on the second attempt.      Balance Overall balance assessment: Needs assistance Sitting-balance support: Bilateral upper extremity supported, Feet supported Sitting balance-Leahy Scale: Poor     Standing balance support: Bilateral upper extremity supported, Reliant on assistive device for balance Standing balance-Leahy Scale: Poor Standing balance comment: Pt unable to stand without AD assistance                           ADL either performed or assessed with clinical judgement   ADL                                         General ADL Comments: Lower body dressing mod to max assist.  Upper body dressing mod assist.  Upper body lower body bathing mod assist.  Toileting max assist including clothing management and hygiene.  Eating with set up standby  assist.  And grooming min assist.     Vision Patient Visual Report: No change from baseline       Perception         Praxis         Pertinent Vitals/Pain Pain Assessment Pain Location: Patient report right hip hurts but unable to do a number.  Cannot have surgery. Pain Intervention(s): Limited activity within patient's tolerance     Extremity/Trunk Assessment Upper Extremity Assessment Upper Extremity Assessment: Generalized weakness (Bilateral shoulder arthritis with severely decreased shoulder range of motion.  Elbow and hands within functional limits to use a walker but decreased grip strength in the right.)           Communication Communication Communication: No apparent difficulties    Cognition Arousal: Alert   Overall Cognitive Status: Impaired/Different from baseline Area of Impairment: Orientation, Safety/judgement                 Orientation Level: Person             General Comments: Not insight  that he needs more assistance from family that they cannot provide.  At the level he is now.     General Comments       Exercises     Shoulder Instructions      Home Living Family/patient expects to be discharged to:: Private residence Living Arrangements: Children Available Help at Discharge: Family;Available PRN/intermittently Type of Home: House Home Access: Ramped entrance     Home Layout: One level     Bathroom Shower/Tub: Sponge bathes at baseline         Home Equipment: Agricultural consultant (2 wheels);Electric scooter;Lift chair;BSC/3in1          Prior Functioning/Environment Prior Level of Function : History of Falls (last six months)               ADLs Comments: 3 daughters assisting with transfers and ambulating with rolling walker- as well as bathing and dressing -the last week needed more assistance prior to that was min assist.  Also assistance with all IADLs.        OT Problem List: Decreased strength;Decreased range of motion;Decreased activity tolerance;Impaired balance (sitting and/or standing);Impaired UE functional use;Pain      OT Treatment/Interventions: Self-care/ADL training;Therapeutic activities;Therapeutic exercise;Neuromuscular education;Patient/family education;DME and/or AE instruction;Balance training    OT Goals(Current goals can be found in the care plan section) Acute Rehab OT Goals Patient Stated Goal: I can get stronger. OT Goal Formulation: With patient/family Time For Goal Achievement: 01/03/23 Potential to Achieve Goals: Fair  OT Frequency: Min 2X/week    Co-evaluation   Reason for Co-Treatment: Complexity of the patient's impairments (multi-system involvement);For patient/therapist  safety;To address functional/ADL transfers;Necessary to address cognition/behavior during functional activity PT goals addressed during session: Mobility/safety with mobility;Balance;Proper use of DME OT goals addressed during session: ADL's and self-care;Proper use of Adaptive equipment and DME;Other (comment) (recommendations and discuss social and family support)      AM-PAC OT "6 Clicks" Louis Activity     Outcome Measure Help from another person eating meals?: A Little Help from another person taking care of personal grooming?: A Little Help from another person toileting, which includes using toliet, bedpan, or urinal?: A Lot Help from another person bathing (including washing, rinsing, drying)?: A Lot Help from another person to put on and taking off regular upper body clothing?: A Lot Help from another person to put on and taking off regular lower body clothing?: A Lot 6 Click  Score: 14   End of Session Equipment Utilized During Treatment: Gait belt;Rolling walker (2 wheels)  Activity Tolerance: Patient tolerated treatment well;Patient limited by pain Patient left: in bed;with family/visitor present  OT Visit Diagnosis: Unsteadiness on feet (R26.81);Muscle weakness (generalized) (M62.81);History of falling (Z91.81);Pain Pain - part of body: Hip                Time: 4854-6270 OT Time Calculation (min): 27 min Charges:  OT General Charges $OT Visit: 1 Visit OT Evaluation $OT Eval Moderate Complexity: 1 Mod    Akiba Melfi OTR/L,CLT 12/27/2022, 1:31 PM

## 2022-12-27 NOTE — Plan of Care (Signed)
Problem: Education: Goal: Knowledge of General Education information will improve Description: Including pain rating scale, medication(s)/side effects and non-pharmacologic comfort measures Outcome: Progressing (Education offered to family members.)   Problem: Health Behavior/Discharge Planning: Goal: Ability to manage health-related needs will improve Outcome: Progressing   Problem: Clinical Measurements: Goal: Ability to maintain clinical measurements within normal limits will improve Outcome: Progressing Goal: Will remain free from infection Outcome: Progressing Goal: Diagnostic test results will improve Outcome: Progressing Goal: Respiratory complications will improve Outcome: Progressing Goal: Cardiovascular complication will be avoided Outcome: Progressing   Problem: Activity: Goal: Risk for activity intolerance will decrease Outcome: Progressing   Problem: Nutrition: Goal: Adequate nutrition will be maintained Outcome: Progressing   Problem: Coping: Goal: Level of anxiety will decrease Outcome: Progressing   Problem: Elimination: Goal: Will not experience complications related to bowel motility Outcome: Progressing Goal: Will not experience complications related to urinary retention Outcome: Progressing   Problem: Pain Management: Goal: General experience of comfort will improve Outcome: Progressing   Problem: Safety: Goal: Ability to remain free from injury will improve Outcome: Progressing   Problem: Skin Integrity: Goal: Risk for impaired skin integrity will decrease Outcome: Progressing   Problem: Education: Goal: Knowledge of disease or condition will improve Outcome: Progressing Goal: Knowledge of secondary prevention will improve (MUST DOCUMENT ALL) Outcome: Progressing Goal: Knowledge of patient specific risk factors will improve Loraine Leriche N/A or DELETE if not current risk factor) Outcome: Progressing   Problem: Ischemic Stroke/TIA Tissue  Perfusion: Goal: Complications of ischemic stroke/TIA will be minimized Outcome: Progressing   Problem: Coping: Goal: Will verbalize positive feelings about self Outcome: Progressing Goal: Will identify appropriate support needs Outcome: Progressing   Problem: Health Behavior/Discharge Planning: Goal: Ability to manage health-related needs will improve Outcome: Progressing Goal: Goals will be collaboratively established with patient/family Outcome: Progressing   Problem: Self-Care: Goal: Ability to participate in self-care as condition permits will improve Outcome: Progressing Goal: Verbalization of feelings and concerns over difficulty with self-care will improve Outcome: Progressing Goal: Ability to communicate needs accurately will improve Outcome: Progressing   Problem: Nutrition: Goal: Risk of aspiration will decrease Outcome: Progressing Goal: Dietary intake will improve Outcome: Progressing

## 2022-12-27 NOTE — Progress Notes (Signed)
PHARMACY - ANTICOAGULATION CONSULT NOTE  Pharmacy Consult for apixaban Indication: atrial fibrillation  Allergies  Allergen Reactions   Contrast Media [Iodinated Contrast Media] Shortness Of Breath   Amoxicillin    Sulfa Antibiotics     Other reaction(s): Unknown Uncoded Allergy. Allergen: IV contrast    Patient Measurements: Height: 5\' 6"  (167.6 cm) Weight: 79 kg (174 lb 2.6 oz) IBW/kg (Calculated) : 63.8  Vital Signs: Temp: 98.1 F (36.7 C) (11/16 1119) BP: 140/79 (11/16 1119) Pulse Rate: 83 (11/16 1119)  Labs: Recent Labs    12/26/22 1221 12/26/22 1225 12/26/22 1818 12/26/22 2100 12/27/22 0847  HGB 12.0*  --   --   --  11.8*  HCT 35.5*  --   --   --  33.6*  PLT 175  --   --   --  203  APTT  --   --   --  43*  --   LABPROT  --   --   --  21.3*  --   INR  --   --   --  1.8*  --   CREATININE 0.67  --   --   --  0.56*  TROPONINIHS  --  19* 21*  --   --    Estimated Creatinine Clearance: 63.1 mL/min (A) (by C-G formula based on SCr of 0.56 mg/dL (L)).  Medical History: Past Medical History:  Diagnosis Date   Arthralgia of ankle or foot 11/20/2014   Atrial fibrillation (HCC)    Degenerative arthritis of hip 04/05/2015   Hyperlipidemia    Hypertension    Myocardial infarction [facility])    Primary osteoarthritis of right knee 05/22/2015   Thyroid disease    Assessment: 89 y/o male presenting with worsening hallucinations. PMH significant for history of hypertension, hyperlipidemia, atrial fibrillation (on apixaban PTA), and CAD status post MI (2015). Pharmacy has been consulted to initiate apixaban.  PTA apixaban dose: 5 mg BID No signs or symptoms of bleeding noted.  Goal of Therapy:  Monitor platelets by anticoagulation protocol: Yes   Plan:  Resume PTA apixaban dose of 5 mg twice daily (dose appropriate for indication) Pharmacy will continue to monitor renal function, CBC, and signs/symptoms of bleeding peripherally  Thank you for involving pharmacy in  this patient's care.   Rockwell Alexandria, PharmD Clinical Pharmacist 12/27/2022 6:47 PM

## 2022-12-27 NOTE — Consult Note (Signed)
NEUROLOGY CONSULT NOTE   Date of service: December 27, 2022 Patient Name: Louis Wells MRN:  [id number] DOB:  14-Nov-1932 Chief Complaint: "Worsening hallucinations" Requesting Provider: Tresa Moore, MD  History of Present Illness  Louis Wells is a 89 y.o. male  has a past medical history of Arthralgia of ankle or foot (11/20/2014), Atrial fibrillation (HCC), Degenerative arthritis of hip (04/05/2015), Hyperlipidemia, Hypertension, Myocardial infarction [facility]), Primary osteoarthritis of right knee (05/22/2015), and Thyroid disease.  And dementia  He had a fall in October followed by increased right hip pain.  He had a diagnosis of avascular necrosis of the right hip in 2017 or so at which time he adamantly refused to have surgery.  He has significantly limited range of motion in his shoulders as well secondary to arthritis.  After his fall his family had moved him from his bedroom to the living room due to limited mobility as they were mainly moving him around in a wheelchair and the wheelchair did not fit through the door to his bedroom.  In this setting he has had increased confusion at home and has been very hard to manage particularly at night, with worsening hallucinations.  Seroquel 25 mg nightly was initiated 4 days ago and has not been effective but also has not been causing any significant side effects.  No concern for seizure activity at home, specific signs and symptoms of focal seizures as well as generalized seizures discussed with family (all negative)  LKW: Not applicable, patient asymptomatic from a stroke perspective Modified rankin score: 4-5 EVT: No, exam not consistent with LVO  NIHSS 5 (unable to state month or age, inconsistently follows simple commands (though more due to personality), drift of the right leg, mild aphasia versus difficulty testing language due to personality and hearing loss)   ROS  Unable to ascertain due to baseline dementia  Past History    Past Medical History:  Diagnosis Date   Arthralgia of ankle or foot 11/20/2014   Atrial fibrillation (HCC)    Degenerative arthritis of hip 04/05/2015   Hyperlipidemia    Hypertension    Myocardial infarction [facility])    Primary osteoarthritis of right knee 05/22/2015   Thyroid disease     Past Surgical History:  Procedure Laterality Date   CORONARY ANGIOPLASTY WITH STENT PLACEMENT      Family History: Family History  Problem Relation Age of Onset   Cancer Mother    Diabetes Father     Social History  reports that he has quit smoking. He has never used smokeless tobacco. He reports that he does not drink alcohol and does not use drugs.  Allergies  Allergen Reactions   Contrast Media [Iodinated Contrast Media] Shortness Of Breath   Amoxicillin    Sulfa Antibiotics     Other reaction(s): Unknown Uncoded Allergy. Allergen: IV contrast    Medications   Current Facility-Administered Medications:    acetaminophen (TYLENOL) tablet 650 mg, 650 mg, Oral, Q6H PRN, Lorretta Harp, MD   aspirin suppository 300 mg, 300 mg, Rectal, Daily **OR** aspirin tablet 325 mg, 325 mg, Oral, Daily, Lorretta Harp, MD, 325 mg at 12/27/22 1122   atorvastatin (LIPITOR) tablet 40 mg, 40 mg, Oral, Daily, Lorretta Harp, MD, 40 mg at 12/27/22 1440   enoxaparin (LOVENOX) injection 40 mg, 40 mg, Subcutaneous, Q24H, Lorretta Harp, MD, 40 mg at 12/27/22 1117   hydrALAZINE (APRESOLINE) injection 5 mg, 5 mg, Intravenous, Q2H PRN, Georgeann Oppenheim, Sudheer B, MD   levothyroxine (SYNTHROID) tablet  100 mcg, 100 mcg, Oral, Q0600, Lorretta Harp, MD, 100 mcg at 12/27/22 8295   linaclotide (LINZESS) capsule 72 mcg, 72 mcg, Oral, Daily, Sreenath, Sudheer B, MD, 72 mcg at 12/27/22 1122   nitroGLYCERIN (NITROSTAT) SL tablet 0.4 mg, 0.4 mg, Sublingual, Q5 min PRN, Lorretta Harp, MD   ondansetron [facility]) injection 4 mg, 4 mg, Intravenous, Q8H PRN, Lorretta Harp, MD   oxyCODONE (Oxy IR/ROXICODONE) immediate release tablet 5 mg, 5 mg, Oral, Q6H PRN,  Lorretta Harp, MD, 5 mg at 01/09/2023 2146   pantoprazole (PROTONIX) EC tablet 40 mg, 40 mg, Oral, Daily, Lorretta Harp, MD, 40 mg at 12/27/22 1116  Vitals   Vitals:   01-09-2023 2045 January 09, 2023 2109 12/27/22 0448 12/27/22 1119  BP:  (!) 150/90 135/85 (!) 140/79  Pulse: 70 77 95 83  Resp: 18 20 18 16   Temp:  98.4 F (36.9 C) 98.4 F (36.9 C) 98.1 F (36.7 C)  TempSrc:  Oral Oral   SpO2: 94% 97% 97% 96%  Weight:  79 kg    Height:  5\' 6"  (1.676 m)      Body mass index is 28.11 kg/m.  Physical Exam   Constitutional: Appears elderly Psych: Affect mildly irritable, insisting on going home repeatedly and loudly throughout my evaluation Eyes: No scleral injection.  HENT: No OP obstruction.  Head: Normocephalic.  Cardiovascular: Normal rate and regular rhythm.  Respiratory: Effort normal, non-labored breathing.  GI: Soft.  No distension. There is no tenderness.  Skin: WDI.   Neurologic Examination   Mental status: Awake, alert, intermittently follows simple commands, poor attention/concentration, fluent casual speech, frequently versus out into Elvis songs/catch phrases Cranial nerves: Tracks examiner bilaterally, hard of hearing at baseline, face symmetric, tongue midline, pupils equal round reactive to light, tongue midline, uvula incompletely visualized due to mouth opening Motor: 5/5 strength throughout the bilateral upper extremities within limits of arthritis-related range of motion limitations.  Significant right hip pain, was able to stand with physical therapy per their notes, left seated on my evaluation due to his increasing insistence on going home and limited interest in participating further in exam Sensation: Reactive to light touch in all 4 extremities Reflexes/coordination/gait: Deferred due to irritability  Labs/Imaging/Neurodiagnostic studies   CBC:  Recent Labs  Lab 2023-01-09 1221 12/27/22 0847  WBC 5.3 6.1  HGB 12.0* 11.8*  HCT 35.5* 33.6*  MCV 103.8* 100.0  PLT  175 203   Basic Metabolic Panel:  Lab Results  Component Value Date   NA 133 (L) 12/27/2022   K 3.9 12/27/2022   CO2 25 12/27/2022   GLUCOSE 86 12/27/2022   BUN 13 12/27/2022   CREATININE 0.56 (L) 12/27/2022   CALCIUM 8.6 (L) 12/27/2022   GFRNONAA >60 12/27/2022   GFRAA 51 (L) 07/03/2015   Lipid Panel:  Lab Results  Component Value Date   LDLCALC 74 01-09-2023   HgbA1c:  Lab Results  Component Value Date   HGBA1C 5.0 Jan 09, 2023   INR  Lab Results  Component Value Date   INR 1.8 (H) 09-Jan-2023   APTT  Lab Results  Component Value Date   APTT 43 (H) 2023/01/09   Carotid Duplex BL(Personally reviewed): Moderate calcified plaque at the level of both carotid bulbs and proximal internal carotid arteries. Estimated bilateral ICA stenosis are less than 50%.   MRI Brain(Personally reviewed): 1. Punctate acute infarct in the left occipital lobe. 2. Significantly motion limited study without evidence of other acute abnormality.    ASSESSMENT   Suspect worsening  dementia in the setting of worsening pain and changes to normal routine secondary to decreasing mobility in a patient unable to be safely managed at home Punctate incidental stroke not felt to explain patient's symptoms and is potentially artifactual in the setting of significant motion artifact  From a stroke risk perspective, medically optimized on Eliquis, LDL essentially meeting goal, A1c meeting goal, and no significant carotid stenosis.  I do not feel that intracranial vessel imaging would change management significantly given he has an indication for Eliquis; he is unlikely to tolerate studies well to the point that they are likely to be nondiagnostic. From a stroke risk perspective, similarly echocardiogram would not change management as he is already anticoagulated  From a dementia perspective, long discussion with family that continued gradual progression is expected.  We also broached CODE STATUS, given  patient's expressed desire for limited interventions such as surgeries in the past and given the severity of his baseline dementia, limited benefit would be expected from cardiopulmonary resuscitation with regards to quality of life.  If he begins to have frequent falls may need to reassess risk/benefit profile of continuing Eliquis.  We discussed the black box warning for antipsychotics including Seroquel in patients with dementia; with understanding of the risks they would like to proceed with increasing the dose to see if it provides some benefit to his day night confusion.  We discussed that patients can often experience a rapid worsening in new environments (such as nursing homes, hospitals).    RECOMMENDATIONS   #Punctate possible stroke -Continue Eliquis at this time -Continue current lipid and glucose management given patient's meeting goals  #Dementia -Palliative care consultation for goals of care at family request -As patient was tolerating Seroquel 25 mg nightly well without significant QT prolongation on admission EKG outpatient, but it was ineffective at that dose, will increase to 50 mg nightly; may continue to titrate gradually for efficacy while monitoring for side effects  Neurology will sign off at this time, please reach out if additional questions or concerns arise ______________________________________________________________________    Signed, Gordy Councilman, MD [facility]

## 2022-12-27 NOTE — Progress Notes (Signed)
SLP Cancellation Note  Patient Details Name: Louis Wells MRN: [id number] DOB: [birth year]   Cancelled treatment:       Reason Eval/Treat Not Completed: SLP screened, no needs identified, will sign off (chart reviewed; multiple Family present in room assisting pt w/ general ADLs.)  Per chart notes, pt admitted s/p increasing weakness, difficulty walking, and AMS.  Pt has a Baseline of "Alzheimer's" per chart.   Pt and Family members denied any difficulty swallowing; pt is currently on a regular diet; tolerates swallowing pills w/ water per NSG. Family was present assisting during breakfast meal this morning. Pt conversed in basic conversation w/out overt language deficits noted; pt's speech was clear and intelligible. Pt began talking about Elvis and singing 2-3 songs w/ impersonations often laughing and smiling. No further skilled ST services indicated as pt appears at his communication baseline. Pt agreed. NSG to reconsult if any change in status while admitted.      Jerilynn Som, MS, CCC-SLP Speech Language Pathologist Rehab Services; [facility] [phone] (ascom) Darly Fails 12/27/2022, 12:01 PM

## 2022-12-28 DIAGNOSIS — R410 Disorientation, unspecified: Secondary | ICD-10-CM | POA: Diagnosis not present

## 2022-12-28 DIAGNOSIS — Z515 Encounter for palliative care: Secondary | ICD-10-CM

## 2022-12-28 DIAGNOSIS — G9341 Metabolic encephalopathy: Secondary | ICD-10-CM | POA: Diagnosis not present

## 2022-12-28 DIAGNOSIS — I639 Cerebral infarction, unspecified: Secondary | ICD-10-CM | POA: Diagnosis not present

## 2022-12-28 NOTE — Consult Note (Signed)
Consultation Note Date: 12/28/2022   Patient Name: Louis Wells  DOB: 11-Jan-1933  MRN: [id number]  Age / Sex: 89 y.o., male  PCP: Barbette Reichmann, MD Referring Physician: Tresa Moore, MD  Reason for Consultation: Establishing goals of care   HPI/Brief Hospital Course: 89 y.o. male  with past medical history of HTN, HLD, CAD s/p stent, A. Fib on Eliquis, dementia admitted from home on 12/26/2022 with altered mental status and progressive weakness.  MRI revealed punctate infarct in left occipital lobe-neurology consulted, stroke felt to be incidental finding and admitting symptoms more in alignment with progressing dementia  Palliative medicine was consulted for assisting with goals of care conversations.  Subjective:  Extensive chart review has been completed prior to meeting patient including labs, vital signs, imaging, progress notes, orders, and available advanced directive documents from current and previous encounters.  Visited with Mr. Lamotte at his bedside. He is sitting up on side of bed eating breakfast. Mr. Murley is aware he is in the hospital but unsure of situation and he is not able to recall visitors in room. Daughters [place] and Velna Hatchet at bedside during time of visit.  Introduced myself as a Publishing rights manager as a member of the palliative care team. Explained palliative medicine is specialized medical care for people living with serious illness. It focuses on providing relief from the symptoms and stress of a serious illness. The goal is to improve quality of life for both the patient and the family.   Daughters share a brief life review for Mr. Swierk. They share he was married to their mother for many years but she unfortunately passed away a few years ago. Since their mothers passing, Mr. Kyllo has required care in his home, between his children they rotate caring for him at night and during the day he has a caregiver  with him.   Daughters discuss that until the middle of October Mr. Peller was able to ambulate with assistance around his home and went out on car rides with his caregiver daily. Since the middle of October they speak to a significant functional decline. This was felt to be related to underlying arthritis and avascular necrosis he was diagnosed with in 2017 (surgery refused). Daughters decided to bring in for ED evaluation as Mr. Tetteh mentation continued to decline.  Daughters discuss at this time, due to the amount of care Mr. Olander is requiring he would not be able to return home.  We discussed patient's current illness and what it means in the larger context of patient's on-going co-morbidities. Natural disease trajectory and expectations at EOL were discussed.   Attempted to elicit goals of care. We discussed code status and the difference between Full Code and Do Not Resuscitate. Encouraged daughters to consider DNR/DNI status understanding evidenced based poor outcomes in similar hospitalized patients, as the cause of the arrest is likely associated with chronic/terminal disease rather than a reversible acute cardio-pulmonary event.  Daughters share in the past their father has told them he would be accepting of any interventions to prolong his life but they acknowledge he is in a different state of health at this time and most recently they share he has spoke on going "home to be with his wife." Before making final decisions regarding code status they request time to speak with their sister-Donna.  The difference between aggressive medical intervention and comfort care in light of Mr. Weisenberg progressing dementia now with significant functional decline.  We discussed the overall philosophy of  hospice and services provided under their care. We also discussed the possibility of SNF for rehab as goal ultimately for Mr. Guzek to return home.  At this time daughters are interested in  pursuing SNF with an understanding of the process including therapy recommendations and insurance authorizations. They also express interest in outpatient palliative care but would also like a chance to discuss with their sister.  I discussed importance of continued conversations with family/support persons and all members of their medical team regarding overall plan of care and treatment options ensuring decisions are in alignment with patients goals of care.  All questions/concerns addressed. PMT will continue to follow and support patient as needed.  Objective: Primary Diagnoses: Present on Admission:  Acute metabolic encephalopathy  Hypothyroidism  Pain Hip (Right)  HTN (hypertension)  CAD (coronary artery disease)  Myocardial injury  Stroke [facility])  Atrial fibrillation, chronic (HCC)  Acute CVA (cerebrovascular accident) [facility])   Physical Exam Constitutional:      General: He is not in acute distress.    Appearance: He is ill-appearing.  Pulmonary:     Effort: Pulmonary effort is normal. No respiratory distress.  Skin:    General: Skin is warm and dry.  Neurological:     Mental Status: He is alert. He is disoriented.     Motor: Weakness present.     Vital Signs: BP 126/80 (BP Location: Right Arm)   Pulse 77   Temp 98.3 F (36.8 C)   Resp 20   Ht 5\' 6"  (1.676 m)   Wt 79 kg   SpO2 96%   BMI 28.11 kg/m  Pain Scale: 0-10 POSS *See Group Information*: 1-Acceptable,Awake and alert Pain Score: 0-No pain  IO: Intake/output summary:  Intake/Output Summary (Last 24 hours) at 12/28/2022 1506 Last data filed at 12/28/2022 1055 Gross per 24 hour  Intake 460 ml  Output --  Net 460 ml    LBM: Last BM Date : 12/24/22 Baseline Weight: Weight: 79.4 kg Most recent weight: Weight: 79 kg      Assessment and Plan  SUMMARY OF RECOMMENDATIONS   Full Code Ongoing GOC needed PMT to continue to follow for ongoing needs and support  Palliative Prophylaxis:   Bowel Regimen,  Delirium Protocol and Frequent Pain Assessment  Discussed With: Primary team   Thank you for this consult and allowing Palliative Medicine to participate in the care of Ellene Route. Palliative medicine will continue to follow and assist as needed.   Time Total: 75 minutes  Time spent includes: Detailed review of medical records (labs, imaging, vital signs), medically appropriate exam (mental status, respiratory, cardiac, skin), discussed with treatment team, counseling and educating patient, family and staff, documenting clinical information, medication management and coordination of care.   Signed by: Leeanne Deed, DNP, AGNP-C Palliative Medicine    Please contact Palliative Medicine Team phone at [phone] for questions and concerns.  For individual provider: See Loretha Stapler

## 2022-12-28 NOTE — Plan of Care (Signed)
  Problem: Education: Goal: Knowledge of General Education information will improve Description: Including pain rating scale, medication(s)/side effects and non-pharmacologic comfort measures Outcome: Progressing   Problem: Health Behavior/Discharge Planning: Goal: Ability to manage health-related needs will improve Outcome: Progressing   Problem: Clinical Measurements: Goal: Ability to maintain clinical measurements within normal limits will improve Outcome: Progressing Goal: Will remain free from infection Outcome: Progressing Goal: Diagnostic test results will improve Outcome: Progressing Goal: Respiratory complications will improve Outcome: Progressing Goal: Cardiovascular complication will be avoided Outcome: Progressing   Problem: Activity: Goal: Risk for activity intolerance will decrease Outcome: Progressing   Problem: Nutrition: Goal: Adequate nutrition will be maintained Outcome: Progressing   Problem: Coping: Goal: Level of anxiety will decrease Outcome: Progressing   Problem: Elimination: Goal: Will not experience complications related to bowel motility Outcome: Progressing   Problem: Elimination: Goal: Will not experience complications related to bowel motility Outcome: Progressing Goal: Will not experience complications related to urinary retention Outcome: Progressing   Problem: Pain Management: Goal: General experience of comfort will improve Outcome: Progressing

## 2022-12-28 NOTE — Progress Notes (Signed)
PROGRESS NOTE    Louis Wells  NWG:[id number] DOB: [birth year] DOA: 12/26/2022 PCP: Barbette Reichmann, MD    Brief Narrative:   89 y.o. male with medical history significant of HTN, HLD, CAD with stent, hypothyroidism, BPH, chronic right hip pain, A fib on Eliquis, who presents with AMS and difficulty walking.    Per patient's 2 daughters at bedside, patient is intermittently more confused than baseline in the past 3 days.  At normal baseline, patient recognizes family members, sometimes orientated to place, usually not orientated to the time.  When I saw patient in ED, patient is confused, knows his own name, not orientated to place and time.  Patient has weakness in both legs, cannot walk as normal he does.  No facial droop or slurred speech.  Patient does not have chest pain, SOB, cough.  Sometimes he has wheezing.  No nausea, vomiting, diarrhea or abdominal pain.  No symptoms of UTI.  Assessment & Plan:   Principal Problem:   Stroke [facility]) Active Problems:   Acute metabolic encephalopathy   CAD (coronary artery disease)   Myocardial injury   Hypothyroidism   HTN (hypertension)   Pain Hip (Right)   Atrial fibrillation, chronic (HCC)   Acute CVA (cerebrovascular accident) (HCC)  Stroke [facility]): MRI showed punctate acute infarct in the left occipital lobe.  Neurology consulted Per neurology stroke is incidental finding and likely not the etiology of the patient's admission Plan: Resume Eliquis Resume statin No indication for echocardiogram PT OT while in house Need skilled nursing facility Medically stable for discharge  Acute metabolic encephalopathy due to stroke -Frequent neurocheck   CAD (coronary artery disease) and myocardial injury: trop is 19 --> 21 -on ASA and lipitor    Hypothyroidism -Synthroid   HTN (hypertension) Normotensive BP goals   Pain Hip (Right): this is chronic issue. -As needed oxycodone and Tylenol   Chronic A-fib: Heart rate 90s Eliquis  on hold.  Heart rate currently controlled.  IBS Home [place]    DVT prophylaxis: Lovenox Code Status: Full Family Communication: Spouse and 2 daughters at bedside 11/16, family member at bedside 11/17 Disposition Plan: Status is: Inpatient Remains inpatient appropriate because: Unsafe discharge plan.  Will need skilled nursing facility     Level of care: Telemetry Medical  Consultants:  Neurology- Bhagat  Procedures:  None  Antimicrobials: None    Subjective: Seen and examined.  Comfortable.  Resting in bed.  Family member at bedside.  Objective: Vitals:   12/27/22 1119 12/27/22 2005 12/28/22 0359 12/28/22 0953  BP: (!) 140/79 126/89 (!) 118/94 126/80  Pulse: 83 77 96 77  Resp: 16 18 17 20   Temp: 98.1 F (36.7 C) 98.1 F (36.7 C) 98.1 F (36.7 C) 98.3 F (36.8 C)  TempSrc:  Oral Oral   SpO2: 96% 99% 96% 96%  Weight:      Height:        Intake/Output Summary (Last 24 hours) at 12/28/2022 1045 Last data filed at 12/27/2022 2135 Gross per 24 hour  Intake 340 ml  Output --  Net 340 ml   Filed Weights   12/26/22 1945 12/26/22 2109  Weight: 79.4 kg 79 kg    Examination:  General exam: NAD Respiratory system: Clear to auscultation. Respiratory effort normal. Cardiovascular system: S1-S2, regular rate, irregular rhythm, no murmurs Gastrointestinal system: Soft, NT/ND, normal bowel sounds Central nervous system: Alert and oriented. No focal neurological deficits. Extremities: Decreased lower bilateral lower extremities.  Gait not assessed Skin: No rashes, lesions  or ulcers Psychiatry: Judgement and insight appear normal. Mood & affect appropriate.     Data Reviewed: I have personally reviewed following labs and imaging studies  CBC: Recent Labs  Lab 12/26/22 1221 12/27/22 0847  WBC 5.3 6.1  HGB 12.0* 11.8*  HCT 35.5* 33.6*  MCV 103.8* 100.0  PLT 175 203   Basic Metabolic Panel: Recent Labs  Lab 12/26/22 1221 12/27/22 0847  NA 132* 133*   K 3.8 3.9  CL 101 99  CO2 23 25  GLUCOSE 93 86  BUN 15 13  CREATININE 0.67 0.56*  CALCIUM 8.5* 8.6*   GFR: Estimated Creatinine Clearance: 63.1 mL/min (A) (by C-G formula based on SCr of 0.56 mg/dL (L)). Liver Function Tests: Recent Labs  Lab 12/26/22 1221  AST 30  ALT 15  ALKPHOS 73  BILITOT 1.1  PROT 6.0*  ALBUMIN 3.3*   No results for input(s): "LIPASE", "AMYLASE" in the last 168 hours. No results for input(s): "AMMONIA" in the last 168 hours. Coagulation Profile: Recent Labs  Lab 12/26/22 2100  INR 1.8*   Cardiac Enzymes: No results for input(s): "CKTOTAL", "CKMB", "CKMBINDEX", "TROPONINI" in the last 168 hours. BNP (last 3 results) No results for input(s): "PROBNP" in the last 8760 hours. HbA1C: Recent Labs    12/26/22 1221  HGBA1C 5.0   CBG: No results for input(s): "GLUCAP" in the last 168 hours. Lipid Profile: Recent Labs    12/26/22 1221  CHOL 133  HDL 49  LDLCALC 74  TRIG 49  CHOLHDL 2.7   Thyroid Function Tests: No results for input(s): "TSH", "T4TOTAL", "FREET4", "T3FREE", "THYROIDAB" in the last 72 hours. Anemia Panel: No results for input(s): "VITAMINB12", "FOLATE", "FERRITIN", "TIBC", "IRON", "RETICCTPCT" in the last 72 hours. Sepsis Labs: No results for input(s): "PROCALCITON", "LATICACIDVEN" in the last 168 hours.  No results found for this or any previous visit (from the past 240 hour(s)).       Radiology Studies: US Carotid Bilateral  Result Date: 12/27/2022 CLINICAL DATA:  Left occipital cerebral infarction. EXAM: BILATERAL CAROTID DUPLEX ULTRASOUND TECHNIQUE: Wallace Cullens scale imaging, color Doppler and duplex ultrasound were performed of bilateral carotid and vertebral arteries in the neck. COMPARISON:  None Available. FINDINGS: Criteria: Quantification of carotid stenosis is based on velocity parameters that correlate the residual internal carotid diameter with NASCET-based stenosis levels, using the diameter of the distal internal  carotid lumen as the denominator for stenosis measurement. The following velocity measurements were obtained: RIGHT ICA:  61/15 cm/sec CCA:  149/27 cm/sec SYSTOLIC ICA/CCA RATIO:  0.4 ECA:  155 cm/sec LEFT ICA:  94/23 cm/sec CCA:  116/23 cm/sec SYSTOLIC ICA/CCA RATIO:  0.8 ECA:  138 cm/sec RIGHT CAROTID ARTERY: Tortuous right common carotid artery. Moderate calcified plaque at the level of the carotid bulb and proximal right ICA. Estimated right ICA stenosis is less than 50%. RIGHT VERTEBRAL ARTERY: Antegrade flow with normal waveform and velocity. LEFT CAROTID ARTERY: Tortuous left common carotid artery. Moderate calcified plaque at the level of the left carotid bulb and proximal left ICA. Estimated left ICA stenosis is less than 50%. LEFT VERTEBRAL ARTERY: Antegrade flow with normal waveform and velocity. IMPRESSION: Moderate calcified plaque at the level of both carotid bulbs and proximal internal carotid arteries. Estimated bilateral ICA stenosis are less than 50%. Electronically Signed   By: Irish Lack M.D.   On: 12/27/2022 12:20   MR BRAIN WO CONTRAST  Result Date: 12/26/2022 EXAM: MRI HEAD WITHOUT CONTRAST TECHNIQUE: Multiplanar, multiecho pulse sequences of the brain and  surrounding structures were obtained without intravenous contrast. COMPARISON:  CT head from today. FINDINGS: Significantly motion limited study.  Within this limitation: Brain: Punctate acute infarct in the left occipital lobe (series 9, image 68). No evidence of acute hemorrhage, mass lesion, midline shift or hydrocephalus. Vascular: No well evaluated due to motion. Skull and upper cervical spine: Normal marrow signal. Sinuses/Orbits: Clear sinuses.  No acute orbital findings. Other: No mastoid effusions. IMPRESSION: 1. Punctate acute infarct in the left occipital lobe. 2. Significantly motion limited study without evidence of other acute abnormality. Electronically Signed   By: Feliberto Harts M.D.   On: 12/26/2022 19:53    CT Head Wo Contrast  Result Date: 12/26/2022 CLINICAL DATA:  Mental status change, unknown cause. Intermittent altered mental status for 2-3 days. EXAM: CT HEAD WITHOUT CONTRAST TECHNIQUE: Contiguous axial images were obtained from the base of the skull through the vertex without intravenous contrast. RADIATION DOSE REDUCTION: This exam was performed according to the departmental dose-optimization program which includes automated exposure control, adjustment of the mA and/or kV according to patient size and/or use of iterative reconstruction technique. COMPARISON:  None Available. FINDINGS: Brain: There is no evidence of an acute infarct, intracranial hemorrhage, mass, midline shift, or extra-axial fluid collection. There is a small chronic infarct inferiorly in the right cerebellar hemisphere. Mild cerebral atrophy is within normal limits for age. Hypodensities in the cerebral white matter nonspecific but compatible with mild chronic small vessel ischemic disease. Vascular: Calcified atherosclerosis at the skull base. No hyperdense vessel. Skull: No acute fracture or suspicious osseous lesion. Sinuses/Orbits: Mild mucosal thickening in the paranasal sinuses. Trace left mastoid fluid. Unremarkable orbits. Other: None. IMPRESSION: 1. No evidence of acute intracranial abnormality. 2. Small chronic right cerebellar infarct. 3. Mild chronic small vessel ischemic disease. Electronically Signed   By: Sebastian Ache M.D.   On: 12/26/2022 13:11        Scheduled Meds:  apixaban  5 mg Oral BID   atorvastatin  40 mg Oral Daily   levothyroxine  100 mcg Oral Q0600   linaclotide  72 mcg Oral Daily   pantoprazole  40 mg Oral Daily   QUEtiapine  50 mg Oral QHS   Continuous Infusions:   LOS: 1 day     Tresa Moore, MD [facility]   If 7PM-7AM, please contact night-coverage  12/28/2022, 10:45 AM

## 2022-12-28 NOTE — Plan of Care (Signed)
  Problem: Education: Goal: Knowledge of General Education information will improve Description: Including pain rating scale, medication(s)/side effects and non-pharmacologic comfort measures Outcome: Progressing (Education presented to family at bedside)   Problem: Health Behavior/Discharge Planning: Goal: Ability to manage health-related needs will improve Outcome: Progressing   Problem: Clinical Measurements: Goal: Ability to maintain clinical measurements within normal limits will improve Outcome: Progressing Goal: Will remain free from infection Outcome: Progressing Goal: Diagnostic test results will improve Outcome: Progressing Goal: Respiratory complications will improve Outcome: Progressing Goal: Cardiovascular complication will be avoided Outcome: Progressing   Problem: Activity: Goal: Risk for activity intolerance will decrease Outcome: Progressing   Problem: Nutrition: Goal: Adequate nutrition will be maintained Outcome: Progressing   Problem: Coping: Goal: Level of anxiety will decrease Outcome: Progressing   Problem: Elimination: Goal: Will not experience complications related to bowel motility Outcome: Progressing Goal: Will not experience complications related to urinary retention Outcome: Progressing   Problem: Pain Management: Goal: General experience of comfort will improve Outcome: Progressing   Problem: Safety: Goal: Ability to remain free from injury will improve Outcome: Progressing   Problem: Skin Integrity: Goal: Risk for impaired skin integrity will decrease Outcome: Progressing   Problem: Education: Goal: Knowledge of disease or condition will improve Outcome: Progressing Goal: Knowledge of secondary prevention will improve (MUST DOCUMENT ALL) Outcome: Progressing Goal: Knowledge of patient specific risk factors will improve Loraine Leriche N/A or DELETE if not current risk factor) Outcome: Progressing   Problem: Ischemic Stroke/TIA Tissue  Perfusion: Goal: Complications of ischemic stroke/TIA will be minimized Outcome: Progressing   Problem: Coping: Goal: Will verbalize positive feelings about self Outcome: Progressing Goal: Will identify appropriate support needs Outcome: Progressing   Problem: Health Behavior/Discharge Planning: Goal: Ability to manage health-related needs will improve Outcome: Progressing Goal: Goals will be collaboratively established with patient/family Outcome: Progressing   Problem: Self-Care: Goal: Ability to participate in self-care as condition permits will improve Outcome: Progressing Goal: Verbalization of feelings and concerns over difficulty with self-care will improve Outcome: Progressing Goal: Ability to communicate needs accurately will improve Outcome: Progressing   Problem: Nutrition: Goal: Risk of aspiration will decrease Outcome: Progressing Goal: Dietary intake will improve Outcome: Progressing

## 2022-12-29 DIAGNOSIS — Z515 Encounter for palliative care: Secondary | ICD-10-CM | POA: Diagnosis not present

## 2022-12-29 DIAGNOSIS — I639 Cerebral infarction, unspecified: Secondary | ICD-10-CM | POA: Diagnosis not present

## 2022-12-29 DIAGNOSIS — G9341 Metabolic encephalopathy: Secondary | ICD-10-CM | POA: Diagnosis not present

## 2022-12-29 MED ORDER — BISACODYL 10 MG RE SUPP: 10.0000 mg | Freq: Every day | RECTAL | Status: DC | PRN

## 2022-12-29 MED ORDER — POLYETHYLENE GLYCOL 3350 17 G PO PACK
17.0000 g | PACK | Freq: Every day | ORAL | Status: DC
  Administered 2022-12-29 – 2022-12-30 (×2): 17 g via ORAL
  Filled 2022-12-29 (×2): qty 1

## 2022-12-29 MED ORDER — SENNOSIDES-DOCUSATE SODIUM 8.6-50 MG PO TABS
1.0000 | ORAL_TABLET | Freq: Two times a day (BID) | ORAL | Status: DC
  Administered 2022-12-29 – 2022-12-30 (×3): 1 via ORAL
  Filled 2022-12-29 (×3): qty 1

## 2022-12-29 NOTE — Progress Notes (Addendum)
Physical Therapy Treatment Patient Details Name: Louis Wells MRN: [id number] DOB: 1932/08/08 Today's Date: 12/29/2022   History of Present Illness Pt is 89 y.o. male with medical history significant of HTN, HLD, CAD with stent, hypothyroidism, BPH, chronic right hip pain, A fib on Eliquis, who presents with AMS and difficulty walking.  Signficant global OA    PT Comments  Difficult situation as pt has significant OA pain/stiffness and poor overall tolerance for WBing or ROM tasks.  With heavy cuing/encouragement from PT/daughter he was willing to try a few exercises and standing bouts but ultimately showed poor tolerance with most acts citing pain as well as general lack of confidence or ability to even move most joints through needed ranges to tolerate/participate in most tasks.   Pt will need continued PT to address functional limitations, continue with POC.     If plan is discharge home, recommend the following: Two people to help with walking and/or transfers;Two people to help with bathing/dressing/bathroom;Assistance with cooking/housework;Direct supervision/assist for medications management;Assist for transportation;Help with stairs or ramp for entrance   Can travel by private vehicle     No  Equipment Recommendations   (TBD at next venue)    Recommendations for Other Services       Precautions / Restrictions Precautions Precautions: Fall Restrictions Weight Bearing Restrictions: No     Mobility  Bed Mobility Overal bed mobility: Needs Assistance Bed Mobility: Supine to Sit, Sit to Supine     Supine to sit: Mod assist Sit to supine: Max assist   General bed mobility comments: Pt made some effort to get to sitting EOB, but ultimately needed to pull up with HHA from PT    Transfers Overall transfer level: Needs assistance Equipment used: Rolling walker (2 wheels) Transfers: Sit to/from Stand Sit to Stand: Mod assist           General transfer comment: 2  standing attempts, heavy modA to rise from standard height bed, light modA to rise from bed elevated ~2".  Pt unable to straighten knees (chronic) fully to get to actual standing and keeps R heel off the ground citing chronic R hip pain.    Ambulation/Gait               General Gait Details: pt refused due to pain in the R hip and feeling like he is going to fall.  However PT did convince him to trial a few side shuffle steps along EOB, pt with very poor tolerance and need to sit down quickly even with maxA to help with unweighting   Stairs             Wheelchair Mobility     Tilt Bed    Modified Rankin (Stroke Patients Only)       Balance Overall balance assessment: Needs assistance Sitting-balance support: Bilateral upper extremity supported, Feet supported Sitting balance-Leahy Scale: Fair     Standing balance support: Bilateral upper extremity supported, Reliant on assistive device for balance Standing balance-Leahy Scale: Poor Standing balance comment: knees staying flexed, heavy UE reliance on walker, very poor tolerance and inability to really maintain w/o constant encouragement and direct assist                            Cognition Arousal: Alert Behavior During Therapy: Restless Overall Cognitive Status: Impaired/Different from baseline Area of Impairment: Orientation, Safety/judgement  General Comments: Pt reports not wanting, or being able, to do a lot.  He c/o pain and weakness that is a significant functional limiter        Exercises General Exercises - Lower Extremity Short Arc Quad: AROM, Strengthening, 10 reps, Both (unable to do LAQ due to OA ROM limitations) Heel Slides: Strengthening, AROM, 5 reps (with resitsed leg ext, limited ROM available b/l) Hip ABduction/ADduction: Strengthening, 10 reps (c/o significant pain with R hip movement, limited range on R)    General Comments General  comments (skin integrity, edema, etc.): Pt's chronic, global OA is a significant functional limiter.  Educated daughter on positioining, skin break down risk, functional prognosis, and answered all questions      Pertinent Vitals/Pain Pain Assessment Pain Assessment: Faces Faces Pain Scale: Hurts even more Pain Location: general joint pain t/o 2/2 significant arthritis; most notedly in right hip (chronic injury as well as OA) - crepitus in nearly all joints with WBing/resistance    Home Living                          Prior Function            PT Goals (current goals can now be found in the care plan section) Progress towards PT goals: Progressing toward goals    Frequency    Min 1X/week      PT Plan      Co-evaluation              AM-PAC PT "6 Clicks" Mobility   Outcome Measure  Help needed turning from your back to your side while in a flat bed without using bedrails?: A Lot Help needed moving from lying on your back to sitting on the side of a flat bed without using bedrails?: A Lot Help needed moving to and from a bed to a chair (including a wheelchair)?: A Lot Help needed standing up from a chair using your arms (e.g., wheelchair or bedside chair)?: A Lot Help needed to walk in hospital room?: Total Help needed climbing 3-5 steps with a railing? : Total 6 Click Score: 10    End of Session Equipment Utilized During Treatment: Gait belt Activity Tolerance: Patient limited by pain;Patient limited by fatigue Patient left: with bed alarm set;with call bell/phone within reach;with family/visitor present Nurse Communication: Mobility status PT Visit Diagnosis: Unsteadiness on feet (R26.81);Other abnormalities of gait and mobility (R26.89);Muscle weakness (generalized) (M62.81);Difficulty in walking, not elsewhere classified (R26.2);Pain Pain - Right/Left: Right Pain - part of body: Hip     Time: 1027-2536 PT Time Calculation (min) (ACUTE ONLY): 34  min  Charges:    $Therapeutic Exercise: 8-22 mins $Therapeutic Activity: 8-22 mins PT General Charges $$ ACUTE PT VISIT: 1 Visit                     Malachi Pro, DPT 12/29/2022, 4:42 PM

## 2022-12-29 NOTE — Plan of Care (Signed)

## 2022-12-29 NOTE — Progress Notes (Signed)
Palliative Care Progress Note, Assessment & Plan   Patient Name: Louis Wells       Date: 12/29/2022 DOB: January 08, 1933  Age: 89 y.o. MRN#: [id number] Attending Physician: Tresa Moore, MD Primary Care Physician: Barbette Reichmann, MD Admit Date: 12/26/2022  Subjective: Patient is lying in bed on his right side.  He is sleeping easily.  He arouses momentarily during my visit but does not open his eyes.  He makes no vocalizations during my visit.  His family, including his daughters, are at bedside during my visit.  HPI: 89 y.o. male  with past medical history of HTN, HLD, CAD s/p stent, A. Fib on Eliquis, dementia admitted from home on 12/26/2022 with altered mental status and progressive weakness.   MRI revealed punctate infarct in left occipital lobe-neurology consulted, stroke felt to be incidental finding and admitting symptoms more in alignment with progressing dementia   Palliative medicine was consulted for assisting with goals of care conversations.  Summary of counseling/coordination of care: Extensive chart review completed prior to meeting patient including labs, vital signs, imaging, progress notes, orders, and available advanced directive documents from current and previous encounters.   After reviewing the patient's chart and assessing the patient at bedside, patient remains unable to participate in goals of care medical decision making independently.  He is resting comfortably in no apparent distress.  I spoke with patient's family - daughter Louis Wells and daughter Louis Wells - at bedside to discuss boundaries and goals of care.  Family shares they recall the conversations from my colleague yesterday in regards to CODE STATUS.  They share they have discussed as a family and are in agreement  for patient's CODE STATUS to be changed to DNR with limited interventions.  I confirmed that in the event of a cardiopulmonary arrest we would not perform CPR or ACLS protocol.  We would allow a natural passing.  Family remains in agreement.  CODE STATUS changed.  Attending and RN made aware.  We discussed additional boundaries of care.  Family was clear that patient would never be accepting of a feeding tube but are open to treating the treatable.  Given patient is medically stable and awaiting transfer to SNF, I introduced the concept of a MOST form.  Discussed importance of advance care planning and making patient's wishes known prior to an urgent event or emergency.  Discussed in detail.  Family was appreciative and shared they would discuss among themselves prior to completing it.  Hard copy of hard choices for loving people booklet given to family for review.  Questions and concerns were addressed.  Awaiting SNF bed and TOC following closely.  Family advised to reach out to PMT with any acute needs.  PMT will continue to follow and support patient and family throughout his hospitalization.  Physical Exam Vitals reviewed.  Constitutional:      General: He is not in acute distress.    Appearance: He is normal weight.  HENT:     Head: Normocephalic.     Mouth/Throat:     Mouth: Mucous membranes are moist.  Eyes:     Pupils: Pupils are equal, round, and reactive to light.  Abdominal:     General:  There is no distension.     Palpations: Abdomen is soft.  Musculoskeletal:     Comments: Generalized weakness  Skin:    General: Skin is warm and dry.  Neurological:     Mental Status: He is alert.  Psychiatric:        Mood and Affect: Mood normal.        Behavior: Behavior normal.             Total Time 35 minutes   Time spent includes: Detailed review of medical records (labs, imaging, vital signs), medically appropriate exam (mental status, respiratory, cardiac, skin), discussed  with treatment team, counseling and educating patient, family and staff, documenting clinical information, medication management and coordination of care.  Samara Deist L. Bonita Quin, DNP, FNP-BC Palliative Medicine Team

## 2022-12-29 NOTE — Progress Notes (Signed)
PROGRESS NOTE    Louis Wells  ZOX:[id number] DOB: September 29, 1932 DOA: 12/26/2022 PCP: Barbette Reichmann, MD    Brief Narrative:   89 y.o. male with medical history significant of HTN, HLD, CAD with stent, hypothyroidism, BPH, chronic right hip pain, A fib on Eliquis, who presents with AMS and difficulty walking.    Per patient's 2 daughters at bedside, patient is intermittently more confused than baseline in the past 3 days.  At normal baseline, patient recognizes family members, sometimes orientated to place, usually not orientated to the time.  When I saw patient in ED, patient is confused, knows his own name, not orientated to place and time.  Patient has weakness in both legs, cannot walk as normal he does.  No facial droop or slurred speech.  Patient does not have chest pain, SOB, cough.  Sometimes he has wheezing.  No nausea, vomiting, diarrhea or abdominal pain.  No symptoms of UTI.  Assessment & Plan:   Principal Problem:   Stroke [facility]) Active Problems:   Acute metabolic encephalopathy   CAD (coronary artery disease)   Myocardial injury   Hypothyroidism   HTN (hypertension)   Pain Hip (Right)   Atrial fibrillation, chronic (HCC)   Acute CVA (cerebrovascular accident) (HCC)  Stroke [facility]): MRI showed punctate acute infarct in the left occipital lobe.  Neurology consulted Per neurology stroke is incidental finding and likely not the etiology of the patient's admission Plan: Continue Eliquis Resume statin No indication for echocardiogram No indication for ASA PT OT while in house Need skilled nursing facility Medically stable for discharge  Acute metabolic encephalopathy due to stroke -Frequent neurocheck   CAD (coronary artery disease) and myocardial injury: trop is 19 --> 21 -on ASA and lipitor    Hypothyroidism -Synthroid   HTN (hypertension) Normotensive BP goals BP controlled   Pain Hip (Right): this is chronic issue. -As needed oxycodone and Tylenol    Chronic A-fib: Heart rate 90s Eliquis on hold.  Heart rate currently controlled.  IBS Home [place]  Goals of care Palliative care engaged.  Recommend DNR status at this time.  Recommendations discussed with 2 daughters at bedside 11/18.  They will discuss with another sibling and return to [place] with a decision.    DVT prophylaxis: Lovenox Code Status: Full Family Communication: Spouse and 2 daughters at bedside 11/16, family member at bedside 11/17, 11/18 Disposition Plan: Status is: Inpatient Remains inpatient appropriate because: Unsafe discharge plan.  Will need skilled nursing facility     Level of care: Telemetry Medical  Consultants:  Neurology- Bhagat  Procedures:  None  Antimicrobials: None    Subjective: Seen and examined.  Comfortable, stable.  No distress.  Objective: Vitals:   12/28/22 1746 12/28/22 1930 12/29/22 0549 12/29/22 0815  BP: 134/73 116/67 116/82 (!) 143/80  Pulse: 76 87 84 80  Resp: 18 18 18 19   Temp: 98.4 F (36.9 C) 98.5 F (36.9 C) 98.1 F (36.7 C) 98 F (36.7 C)  TempSrc:  Oral Oral Oral  SpO2: 97% 98% 96% 95%  Weight:      Height:        Intake/Output Summary (Last 24 hours) at 12/29/2022 1059 Last data filed at 12/29/2022 1052 Gross per 24 hour  Intake 380 ml  Output --  Net 380 ml   Filed Weights   12/26/22 1945 12/26/22 2109  Weight: 79.4 kg 79 kg    Examination:  General exam: No acute distress Respiratory system: Clear to auscultation. Respiratory effort normal.  Cardiovascular system: S1-S2, regular rate, irregular rhythm, no murmurs Gastrointestinal system: Soft, NT/ND, normal bowel sounds Central nervous system: Alert and oriented. No focal neurological deficits. Extremities: Decreased lower bilateral lower extremities.  Gait not assessed Skin: No rashes, lesions or ulcers Psychiatry: Judgement and insight appear normal. Mood & affect appropriate.     Data Reviewed: I have personally reviewed following  labs and imaging studies  CBC: Recent Labs  Lab 12/26/22 1221 12/27/22 0847  WBC 5.3 6.1  HGB 12.0* 11.8*  HCT 35.5* 33.6*  MCV 103.8* 100.0  PLT 175 203   Basic Metabolic Panel: Recent Labs  Lab 12/26/22 1221 12/27/22 0847  NA 132* 133*  K 3.8 3.9  CL 101 99  CO2 23 25  GLUCOSE 93 86  BUN 15 13  CREATININE 0.67 0.56*  CALCIUM 8.5* 8.6*   GFR: Estimated Creatinine Clearance: 63.1 mL/min (A) (by C-G formula based on SCr of 0.56 mg/dL (L)). Liver Function Tests: Recent Labs  Lab 12/26/22 1221  AST 30  ALT 15  ALKPHOS 73  BILITOT 1.1  PROT 6.0*  ALBUMIN 3.3*   No results for input(s): "LIPASE", "AMYLASE" in the last 168 hours. No results for input(s): "AMMONIA" in the last 168 hours. Coagulation Profile: Recent Labs  Lab 12/26/22 2100  INR 1.8*   Cardiac Enzymes: No results for input(s): "CKTOTAL", "CKMB", "CKMBINDEX", "TROPONINI" in the last 168 hours. BNP (last 3 results) No results for input(s): "PROBNP" in the last 8760 hours. HbA1C: Recent Labs    12/26/22 1221  HGBA1C 5.0   CBG: No results for input(s): "GLUCAP" in the last 168 hours. Lipid Profile: Recent Labs    12/26/22 1221  CHOL 133  HDL 49  LDLCALC 74  TRIG 49  CHOLHDL 2.7   Thyroid Function Tests: No results for input(s): "TSH", "T4TOTAL", "FREET4", "T3FREE", "THYROIDAB" in the last 72 hours. Anemia Panel: No results for input(s): "VITAMINB12", "FOLATE", "FERRITIN", "TIBC", "IRON", "RETICCTPCT" in the last 72 hours. Sepsis Labs: No results for input(s): "PROCALCITON", "LATICACIDVEN" in the last 168 hours.  No results found for this or any previous visit (from the past 240 hour(s)).       Radiology Studies: No results found.      Scheduled Meds:  apixaban  5 mg Oral BID   atorvastatin  40 mg Oral Daily   levothyroxine  100 mcg Oral Q0600   linaclotide  72 mcg Oral Daily   pantoprazole  40 mg Oral Daily   polyethylene glycol  17 g Oral Daily   QUEtiapine  50 mg  Oral QHS   senna-docusate  1 tablet Oral BID   Continuous Infusions:   LOS: 2 days     Tresa Moore, MD [facility]   If 7PM-7AM, please contact night-coverage  12/29/2022, 10:59 AM

## 2022-12-29 NOTE — [place] FL2 (Signed)
[place] MEDICAID FL2 LEVEL OF CARE FORM     IDENTIFICATION  Patient Name: Louis Wells Birthdate: [birth year] Sex: male Admission Date (Current Location): 12/26/2022  [facility] and [place] Number:  Chiropodist and Address:  [facility], [address], [place], [place] [address]      Provider Number: [id number]  Attending Physician Name and Address:  Tresa Moore, MD  Relative Name and Phone Number:  Sharyn Creamer (Daughter)  [phone]    Current Level of Care: Hospital Recommended Level of Care: Skilled Nursing Facility Prior Approval Number:    Date Approved/Denied:   PASRR Number: [id number]  Discharge Plan: SNF    Current Diagnoses: Patient Active Problem List   Diagnosis Date Noted   Acute CVA (cerebrovascular accident) (HCC) 12/27/2022   Acute metabolic encephalopathy 12/26/2022   HTN (hypertension) 12/26/2022   CAD (coronary artery disease) 12/26/2022   Myocardial injury 12/26/2022   Oxygen desaturation 12/26/2022   Stroke (HCC) 12/26/2022   Dehydration 10/09/2020   Chronic pain syndrome 02/18/2016   Knee hemarthrosis (Right) 11/20/2015   Baker's cyst of knee (B) (R>L) 11/07/2015   Encounter for chronic pain management 09/06/2015   Opioid-induced constipation (OIC) 09/06/2015   Fluid in knee joint (Right) 08/07/2015   Osteoarthritis of knee (Location of Secondary source of pain) (Right) 07/30/2015   Avascular necrosis of femur head (HCC) (Right) 07/30/2015   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) 07/30/2015   Long term prescription opiate use 07/03/2015   Opiate use (10 MME/Day) 07/03/2015   Encounter for therapeutic drug level monitoring 07/03/2015   Disturbance of skin sensation 07/03/2015   Pain Hip (Right) 07/03/2015   Pain in ankle 07/03/2015   Chronic knee pain (Location of Secondary source of pain) (Bilateral) (R>L) 07/03/2015   Osteoarthritis of hip (Location of Primary Source of Pain)  (Right) 07/03/2015   Osteoarthritis of ankle (Location of Tertiary source of pain) (Bilateral) (L>R) 07/03/2015   Pain in right knee 07/03/2015   Chronic kidney disease, stage III (moderate) (HCC) 04/05/2015   Long term current use of opiate analgesic 04/05/2015   Acute myocardial infarction of anterior wall (HCC) 11/14/2013   History of cardiac catheterization 11/14/2013   Hyperlipidemia 11/14/2013   Atrial fibrillation, chronic (HCC) 06/22/2013   Arteriosclerosis of coronary artery 06/22/2013   BP (high blood pressure) 06/22/2013   Hypothyroidism 06/22/2013    Orientation RESPIRATION BLADDER Height & Weight     Self, Place  Normal Continent Weight: 174 lb 2.6 oz (79 kg) Height:  5\' 6"  (167.6 cm)  BEHAVIORAL SYMPTOMS/MOOD NEUROLOGICAL BOWEL NUTRITION STATUS      Continent Diet  AMBULATORY STATUS COMMUNICATION OF NEEDS Skin     Verbally Normal                       Personal Care Assistance Level of Assistance  Bathing, Feeding, Dressing Bathing Assistance: Maximum assistance Feeding assistance: Maximum assistance Dressing Assistance: Maximum assistance     Functional Limitations Info  Sight, Hearing, Speech Sight Info: Impaired Hearing Info: Impaired Speech Info: Adequate    SPECIAL CARE FACTORS FREQUENCY  OT (By licensed OT), PT (By licensed PT)     PT Frequency: 5 times a week OT Frequency: 5 times a week            Contractures Contractures Info: Not present    Additional Factors Info  Code Status, Allergies Code Status Info: FULL Allergies Info: Contrast Media (Iodinated Contrast Media)  Amoxicillin  Sulfa Antibiotics           Current Medications (12/29/2022):  This is the current hospital active medication list Current Facility-Administered Medications  Medication Dose Route Frequency Provider Last Rate Last Admin   acetaminophen (TYLENOL) tablet 650 mg  650 mg Oral Q6H PRN Lorretta Harp, MD       apixaban Everlene Balls) tablet 5 mg  5 mg Oral BID  Rockwell Alexandria, RPH   5 mg at 12/29/22 0931   atorvastatin (LIPITOR) tablet 40 mg  40 mg Oral Daily Lorretta Harp, MD   40 mg at 12/29/22 0931   bisacodyl (DULCOLAX) suppository 10 mg  10 mg Rectal Daily PRN Lolita Patella B, MD       hydrALAZINE (APRESOLINE) injection 5 mg  5 mg Intravenous Q2H PRN Lolita Patella B, MD       levothyroxine (SYNTHROID) tablet 100 mcg  100 mcg Oral Q0600 Lorretta Harp, MD   100 mcg at 12/29/22 4259   linaclotide (LINZESS) capsule 72 mcg  72 mcg Oral Daily Lolita Patella B, MD   72 mcg at 12/29/22 0931   nitroGLYCERIN (NITROSTAT) SL tablet 0.4 mg  0.4 mg Sublingual Q5 min PRN Lorretta Harp, MD       ondansetron [facility]) injection 4 mg  4 mg Intravenous Q8H PRN Lorretta Harp, MD       oxyCODONE (Oxy IR/ROXICODONE) immediate release tablet 5 mg  5 mg Oral Q6H PRN Lorretta Harp, MD   5 mg at 12/29/22 0554   pantoprazole (PROTONIX) EC tablet 40 mg  40 mg Oral Daily Lorretta Harp, MD   40 mg at 12/29/22 0931   polyethylene glycol (MIRALAX / GLYCOLAX) packet 17 g  17 g Oral Daily Lolita Patella B, MD   17 g at 12/29/22 0930   QUEtiapine (SEROQUEL) tablet 50 mg  50 mg Oral QHS Bhagat, Srishti L, MD   50 mg at 12/28/22 2207   senna-docusate (Senokot-S) tablet 1 tablet  1 tablet Oral BID Lolita Patella B, MD   1 tablet at 12/29/22 5638     Discharge Medications: Please see discharge summary for a list of discharge medications.  Relevant Imaging Results:  Relevant Lab Results:   Additional Information [id number]  Allena Katz, LCSW

## 2022-12-29 NOTE — TOC Transition Note (Signed)
Transition of Care [facility]) - CM/SW Discharge Note   Patient Details  Name: Louis Wells MRN: [id number] Date of Birth: [birth year]  Transition of [facility]) CM/SW Contact:  Allena Katz, LCSW Phone Number: 12/29/2022, 11:39 AM   Clinical Narrative:   CSW met with family at bedside to discuss rehab. Family requested medicare.gov list. List was provided. Familys top choice is [facility] they do not want [facility]. Pt lives alone with caregivers coming in during the day and family staying at night. CSW to start workup.           Patient Goals and CMS Choice      Discharge Placement                         Discharge Plan and Services Additional resources added to the After Visit Summary for                                       Social Determinants of Health (SDOH) Interventions SDOH Screenings   Food Insecurity: No Food Insecurity (12/26/2022)  Housing: Low Risk  (12/26/2022)  Transportation Needs: No Transportation Needs (12/26/2022)  Utilities: Not At Risk (12/26/2022)  Financial Resource Strain: Low Risk  (11/11/2022)   Received from [facility]  Tobacco Use: Medium Risk (12/26/2022)     Readmission Risk Interventions     No data to display

## 2022-12-30 DIAGNOSIS — Z515 Encounter for palliative care: Secondary | ICD-10-CM | POA: Diagnosis not present

## 2022-12-30 DIAGNOSIS — I639 Cerebral infarction, unspecified: Secondary | ICD-10-CM | POA: Diagnosis not present

## 2022-12-30 MED ORDER — SENNOSIDES-DOCUSATE SODIUM 8.6-50 MG PO TABS: 1.0000 | ORAL_TABLET | Freq: Two times a day (BID) | ORAL | Status: DC

## 2022-12-30 MED ORDER — QUETIAPINE FUMARATE 50 MG PO TABS: 50.0000 mg | ORAL_TABLET | Freq: Every day | ORAL | Status: DC

## 2022-12-30 MED ORDER — ATORVASTATIN CALCIUM 40 MG PO TABS: 40.0000 mg | ORAL_TABLET | Freq: Every day | ORAL | Status: DC

## 2022-12-30 NOTE — Plan of Care (Signed)

## 2022-12-30 NOTE — Progress Notes (Signed)
[facility] Liaison Note:   (new referral for outpatient palliative services)  Notified by [facility], Allena Katz, LCSW,  of patient/family request for [facility] Palliative Care services at [facility].   Referral submitted today.    Please call with any hospice or outpatient palliative care related questions.  Thank you for the opportunity to participate in this patient's care.  Redge Gainer, [facility] Liaison [phone]

## 2022-12-30 NOTE — TOC Transition Note (Addendum)
Transition of Care [facility]) - CM/SW Discharge Note   Patient Details  Name: Louis Wells MRN: [id number] Date of Birth: 1932-02-19  Transition of [facility]) CM/SW Contact:  Allena Katz, LCSW Phone Number: 12/30/2022, 11:19 AM   Clinical Narrative:   [facility] RN given number for report. Dc summary sent. Medical necessity printed to unit. Family notified. Tammy notified. DNR signed and on chart. Referral made to [facility] pallative per family request.    Final next level of care: Skilled Nursing Facility Barriers to Discharge: Barriers Resolved   Patient Goals and CMS Choice CMS Medicare.gov Compare Post Acute Care list provided to:: Patient    Discharge Placement                [facility] at: [facility] Patient to be transferred to facility by: [facility] Name of family member notified: juanita Patient and family notified of of transfer: 12/30/22  Discharge Plan and Services Additional resources added to the After Visit Summary for                                       Social Determinants of Health (SDOH) Interventions SDOH Screenings   Food Insecurity: No Food Insecurity (12/26/2022)  Housing: Low Risk  (12/26/2022)  Transportation Needs: No Transportation Needs (12/26/2022)  Utilities: Not At Risk (12/26/2022)  Financial Resource Strain: Low Risk  (11/11/2022)   Received from [facility]  Tobacco Use: Medium Risk (12/26/2022)     Readmission Risk Interventions     No data to display

## 2022-12-30 NOTE — TOC Progression Note (Signed)
Transition of Care [facility]) - Progression Note    Patient Details  Name: Louis Wells MRN: [id number] Date of Birth: [birth year]  [facility]) CM/SW Contact  Allena Katz, LCSW Phone Number: 12/30/2022, 10:18 AM  Clinical Narrative:   CSW shared bed offers with family. They will call and get back to me today on what their decision is.         Expected Discharge Plan and Services                                               Social Determinants of Health (SDOH) Interventions SDOH Screenings   Food Insecurity: No Food Insecurity (12/26/2022)  Housing: Low Risk  (12/26/2022)  Transportation Needs: No Transportation Needs (12/26/2022)  Utilities: Not At Risk (12/26/2022)  Financial Resource Strain: Low Risk  (11/11/2022)   Received from [facility]  Tobacco Use: Medium Risk (12/26/2022)    Readmission Risk Interventions     No data to display

## 2022-12-30 NOTE — Plan of Care (Signed)
  Problem: Education: Goal: Knowledge of General Education information will improve Description: Including pain rating scale, medication(s)/side effects and non-pharmacologic comfort measures Outcome: Progressing   Problem: Health Behavior/Discharge Planning: Goal: Ability to manage health-related needs will improve Outcome: Progressing   Problem: Clinical Measurements: Goal: Ability to maintain clinical measurements within normal limits will improve Outcome: Progressing Goal: Will remain free from infection Outcome: Progressing Goal: Diagnostic test results will improve Outcome: Progressing Goal: Respiratory complications will improve Outcome: Progressing Goal: Cardiovascular complication will be avoided Outcome: Progressing   Problem: Activity: Goal: Risk for activity intolerance will decrease Outcome: Progressing   Problem: Nutrition: Goal: Adequate nutrition will be maintained Outcome: Progressing   Problem: Coping: Goal: Level of anxiety will decrease Outcome: Progressing   Problem: Elimination: Goal: Will not experience complications related to bowel motility Outcome: Progressing Goal: Will not experience complications related to urinary retention Outcome: Progressing   Problem: Pain Management: Goal: General experience of comfort will improve Outcome: Progressing   Problem: Safety: Goal: Ability to remain free from injury will improve Outcome: Progressing   Problem: Skin Integrity: Goal: Risk for impaired skin integrity will decrease Outcome: Progressing   Problem: Education: Goal: Knowledge of disease or condition will improve 12/30/2022 0633 by Jorge Ny, RN Outcome: Progressing 12/30/2022 0315 by Jorge Ny, RN Outcome: Progressing Goal: Knowledge of secondary prevention will improve (MUST DOCUMENT ALL) 12/30/2022 0633 by Jorge Ny, RN Outcome: Progressing 12/30/2022 0315 by Jorge Ny, RN Outcome: Progressing Goal:  Knowledge of patient specific risk factors will improve Loraine Leriche N/A or DELETE if not current risk factor) 12/30/2022 0633 by Jorge Ny, RN Outcome: Progressing 12/30/2022 0315 by Jorge Ny, RN Outcome: Progressing   Problem: Ischemic Stroke/TIA Tissue Perfusion: Goal: Complications of ischemic stroke/TIA will be minimized 12/30/2022 1610 by Jorge Ny, RN Outcome: Progressing 12/30/2022 0315 by Jorge Ny, RN Outcome: Progressing   Problem: Coping: Goal: Will verbalize positive feelings about self 12/30/2022 0633 by Jorge Ny, RN Outcome: Progressing 12/30/2022 0315 by Jorge Ny, RN Outcome: Progressing Goal: Will identify appropriate support needs 12/30/2022 0633 by Jorge Ny, RN Outcome: Progressing 12/30/2022 0315 by Jorge Ny, RN Outcome: Progressing   Problem: Health Behavior/Discharge Planning: Goal: Ability to manage health-related needs will improve 12/30/2022 0633 by Jorge Ny, RN Outcome: Progressing 12/30/2022 0315 by Jorge Ny, RN Outcome: Progressing Goal: Goals will be collaboratively established with patient/family 12/30/2022 [phone] by Jorge Ny, RN Outcome: Progressing 12/30/2022 0315 by Jorge Ny, RN Outcome: Progressing   Problem: Self-Care: Goal: Ability to participate in self-care as condition permits will improve 12/30/2022 0633 by Jorge Ny, RN Outcome: Progressing 12/30/2022 0315 by Jorge Ny, RN Outcome: Progressing Goal: Verbalization of feelings and concerns over difficulty with self-care will improve Outcome: Progressing Goal: Ability to communicate needs accurately will improve Outcome: Progressing   Problem: Nutrition: Goal: Risk of aspiration will decrease Outcome: Progressing Goal: Dietary intake will improve Outcome: Progressing
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
## 2022-12-30 NOTE — Progress Notes (Signed)
Palliative Care Progress Note, Assessment & Plan   Patient Name: Louis Wells       Date: 12/30/2022 DOB: Feb 25, 1932  Age: 89 y.o. MRN#: [id number] Attending Physician: Tresa Moore, MD Primary Care Physician: Barbette Reichmann, MD Admit Date: 12/26/2022  Subjective: Patient is lying in bed on his right side, sleeping in no apparent distress.  His daughters are at bedside.  Respirations are even and unlabored.  Nonverbal signs of pain not noted.  HPI: 89 y.o. male  with past medical history of HTN, HLD, CAD s/p stent, A. Fib on Eliquis, dementia admitted from home on 12/26/2022 with altered mental status and progressive weakness.   MRI revealed punctate infarct in left occipital lobe-neurology consulted, stroke felt to be incidental finding and admitting symptoms more in alignment with progressing dementia   Palliative medicine was consulted for assisting with goals of care conversations.  Summary of counseling/coordination of care: Extensive chart review completed prior to meeting patient including labs, vital signs, imaging, progress notes, orders, and available advanced directive documents from current and previous encounters.   After reviewing the patient's chart and assessing the patient at bedside, I spoke with patient's family in regards to symptom management and plan of care.  Family endorses patient had a quiet night with no acute issues.  They endorse she had a healthy appetite and ate almost all of his breakfast this morning.  They say he has been sleeping more, which is out of character for him.  They share that even when he was at his sickest he would at least get out of bed and sit to a chair.  They are concerned that he will not want to go to SNF for rehab.  However, they are going  to continue to reiterate that it is to improve his functional status to hopefully get him strong enough to return home.  Therapeutic silence, active listening, and emotional support provided.  Daughters requested outpatient palliative services to follow.  Order placed.  DNR with limited interventions remains.  I completed a goldenrod form, placed paper copy in shadow chart, and emailed copy of DNR to medical records for download to ACP tab in epic.  No further palliative needs at this time.  Plan is for patient [facility] today.  Discharge summary in place.  Please reengage with PMT if acute palliative needs arise prior to discharge.  Physical Exam Vitals reviewed.  Constitutional:      General: He is not in acute distress.    Appearance: He is normal weight.  HENT:     Head: Normocephalic.     Mouth/Throat:     Mouth: Mucous membranes are moist.  Pulmonary:     Effort: Pulmonary effort is normal.  Abdominal:     Palpations: Abdomen is soft.  Musculoskeletal:     Comments: Generalized weakness  Skin:    General: Skin is warm and dry.  Psychiatric:        Mood and Affect: Mood normal.        Behavior: Behavior normal.             Total Time 35 minutes   Time spent includes: Detailed review of  medical records (labs, imaging, vital signs), medically appropriate exam (mental status, respiratory, cardiac, skin), discussed with treatment team, counseling and educating patient, family and staff, documenting clinical information, medication management and coordination of care.  Samara Deist L. Bonita Quin, DNP, FNP-BC Palliative Medicine Team

## 2022-12-30 NOTE — Care Management Important Message (Signed)
Important Message  Patient Details  Name: ELKIN VAZIRI MRN: [id number] Date of Birth: Dec 21, 1932   Important Message Given:  Yes - Medicare IM     Reilyn Nelson, Stephan Minister 12/30/2022, 10:31 AM

## 2023-06-11 DEATH — deceased
